# Patient Record
Sex: Male | Born: 1985 | Race: White | Hispanic: No | Marital: Married
Health system: Midwestern US, Community
[De-identification: ages and names within clinical notes are randomized; demographics above are authoritative.]

## PROBLEM LIST (undated history)

## (undated) DIAGNOSIS — G43909 Migraine, unspecified, not intractable, without status migrainosus: Secondary | ICD-10-CM

## (undated) DIAGNOSIS — D6851 Activated protein C resistance: Secondary | ICD-10-CM

## (undated) DIAGNOSIS — S069XAA Unspecified intracranial injury with loss of consciousness status unknown, initial encounter: Secondary | ICD-10-CM

## (undated) DIAGNOSIS — K311 Adult hypertrophic pyloric stenosis: Secondary | ICD-10-CM

## (undated) DIAGNOSIS — K561 Intussusception: Secondary | ICD-10-CM

## (undated) DIAGNOSIS — F4310 Post-traumatic stress disorder, unspecified: Secondary | ICD-10-CM

## (undated) DIAGNOSIS — K219 Gastro-esophageal reflux disease without esophagitis: Secondary | ICD-10-CM

## (undated) DIAGNOSIS — I2699 Other pulmonary embolism without acute cor pulmonale: Secondary | ICD-10-CM

## (undated) DIAGNOSIS — K3184 Gastroparesis: Secondary | ICD-10-CM

## (undated) DIAGNOSIS — A419 Sepsis, unspecified organism: Secondary | ICD-10-CM

## (undated) DIAGNOSIS — R569 Unspecified convulsions: Secondary | ICD-10-CM

## (undated) DIAGNOSIS — S069X9A Unspecified intracranial injury with loss of consciousness of unspecified duration, initial encounter: Secondary | ICD-10-CM

## (undated) DIAGNOSIS — K56609 Unspecified intestinal obstruction, unspecified as to partial versus complete obstruction: Secondary | ICD-10-CM

## (undated) DIAGNOSIS — I82409 Acute embolism and thrombosis of unspecified deep veins of unspecified lower extremity: Secondary | ICD-10-CM

## (undated) DIAGNOSIS — J9811 Atelectasis: Secondary | ICD-10-CM

## (undated) HISTORY — PX: CHOLECYSTECTOMY: SHX55

## (undated) HISTORY — PX: COLON SURGERY: SHX602

## (undated) HISTORY — PX: BACK SURGERY: SHX140

## (undated) HISTORY — PX: LASIK: SHX215

## (undated) HISTORY — PX: APPENDECTOMY: SHX54

## (undated) HISTORY — PX: HAND SURGERY: SHX662

## (undated) HISTORY — PX: ABDOMINAL SURGERY: SHX537

## (undated) HISTORY — PX: APPENDECTOMY (OPEN): SHX54

## (undated) HISTORY — PX: OTHER SURGICAL HISTORY: SHX169

---

## 2007-05-10 ENCOUNTER — Emergency Department
Admission: EM | Admit: 2007-05-10 | Disposition: A | Discharge status: Home / Self Care | Payer: Self-pay | Source: Emergency Department | Admitting: Emergency Medicine

## 2007-05-12 LAB — COMPREHENSIVE METABOLIC PANEL
ALT: 43 U/L (ref 7–56)
AST (SGOT): 36 U/L (ref 5–40)
Albumin, Synovial: 4.9 g/dL (ref 3.9–5.0)
Alkaline Phosphatase: 63 U/L (ref 38–126)
BUN / Creatinine Ratio: 17 (ref 8–20)
BUN: 19 mg/dL (ref 6–20)
Bilirubin, Total: 0.6 mg/dL (ref 0.2–1.3)
CO2: 22 mmol/L (ref 21.0–31.0)
Calcium: 9.7 mg/dL (ref 8.4–10.2)
Chloride: 101 mmol/L (ref 101–111)
Creatinine: 1.13 mg/dL (ref 0.5–1.4)
EGFR: >60 mL/min/{1.73_m2}
EGFR: >60 mL/min/{1.73_m2}
Glucose: 93 mg/dL (ref 70–100)
Potassium: 4.4 mmol/L (ref 3.6–5.0)
Protein, Total: 7.9 g/dL (ref 6.3–8.2)
Sodium: 137 mmol/L (ref 135–145)

## 2007-05-12 LAB — ^CBC WITH DIFF MCKESSON
BASOPHILS %: 0.2 % (ref 0–2)
Baso(Absolute): 0
Eosinophils %: 1.5 % (ref 0–6)
Eosinophils Absolute: 0.1
Hematocrit: 47.4 % (ref 39.0–49.0)
Hemoglobin: 16.2 g/dL (ref 13.2–17.3)
Lymphocytes Absolute: 1.6
Lymphocytes Relative: 18.8 % — ABNORMAL LOW (ref 25–55)
MCH: 30.2 pg (ref 27.0–34.0)
MCHC: 34.3 % (ref 32.0–36.0)
MCV: 88 fL (ref 80–100)
Monocytes Absolute: 0.5
Monocytes Relative %: 6.2 % (ref 1–8)
Neutrophils Absolute: 6.2
Neutrophils Relative %: 73.3 % — ABNORMAL HIGH (ref 49–69)
Platelets: 299 10*3/uL (ref 150–400)
RBC: 5.38 /mm3 (ref 3.80–5.40)
RDW: 12.2 % (ref 11.0–14.0)
WBC: 8.5 10*3/uL (ref 4.8–10.8)

## 2007-05-12 LAB — LIPASE: Lipase: 60 U/L (ref 23–300)

## 2013-10-30 ENCOUNTER — Emergency Department
Admission: EM | Admit: 2013-10-30 | Discharge: 2013-10-31 | Disposition: A | Discharge status: Home / Self Care | Payer: Medicare Other | Attending: Emergency Medicine | Admitting: Emergency Medicine

## 2013-10-30 ENCOUNTER — Emergency Department: Payer: Medicare Other

## 2013-10-30 DIAGNOSIS — R51 Headache: Secondary | ICD-10-CM | POA: Insufficient documentation

## 2013-10-30 DIAGNOSIS — R519 Headache, unspecified: Secondary | ICD-10-CM

## 2013-10-30 DIAGNOSIS — K219 Gastro-esophageal reflux disease without esophagitis: Secondary | ICD-10-CM | POA: Insufficient documentation

## 2013-10-30 DIAGNOSIS — F4310 Post-traumatic stress disorder, unspecified: Secondary | ICD-10-CM | POA: Insufficient documentation

## 2013-10-30 DIAGNOSIS — Z86711 Personal history of pulmonary embolism: Secondary | ICD-10-CM | POA: Insufficient documentation

## 2013-10-30 DIAGNOSIS — Z87820 Personal history of traumatic brain injury: Secondary | ICD-10-CM | POA: Insufficient documentation

## 2013-10-30 HISTORY — DX: Other pulmonary embolism without acute cor pulmonale: I26.99

## 2013-10-30 HISTORY — DX: Unspecified intracranial injury with loss of consciousness status unknown, initial encounter: S06.9XAA

## 2013-10-30 HISTORY — DX: Unspecified convulsions: R56.9

## 2013-10-30 HISTORY — DX: Gastro-esophageal reflux disease without esophagitis: K21.9

## 2013-10-30 HISTORY — DX: Post-traumatic stress disorder, unspecified: F43.10

## 2013-10-30 HISTORY — DX: Unspecified intracranial injury with loss of consciousness of unspecified duration, initial encounter: S06.9X9A

## 2013-10-30 NOTE — ED Notes (Signed)
Bed low position, rails up x 1, sister at bedside. Light low for Pt's comfort.
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
## 2013-10-31 MED ORDER — HYDROMORPHONE HCL PF 1 MG/ML IJ SOLN
4.0000 mg | Freq: Once | INTRAMUSCULAR | Status: AC
Start: 2013-10-31 — End: 2013-10-31
  Administered 2013-10-31: 4 mg via INTRAVENOUS
  Filled 2013-10-31: qty 4

## 2013-10-31 MED ORDER — PROMETHAZINE HCL 25 MG/ML IJ SOLN
50.0000 mg | Freq: Once | INTRAMUSCULAR | Status: AC
Start: 2013-10-31 — End: 2013-10-31
  Administered 2013-10-31: 50 mg via INTRAVENOUS
  Filled 2013-10-31: qty 2

## 2013-10-31 MED ORDER — SODIUM CHLORIDE 0.9 % IV SOLN
INTRAVENOUS | Status: DC
Start: 2013-10-31 — End: 2013-10-31

## 2013-10-31 MED ORDER — DIPHENHYDRAMINE HCL 50 MG/ML IJ SOLN
50.0000 mg | Freq: Once | INTRAMUSCULAR | Status: AC
Start: 2013-10-31 — End: 2013-10-31
  Administered 2013-10-31: 50 mg via INTRAVENOUS
  Filled 2013-10-31: qty 1

## 2013-10-31 MED ORDER — SODIUM CHLORIDE 0.9 % IV SOLN
Freq: Once | INTRAVENOUS | Status: AC
Start: 2013-10-31 — End: 2013-10-31
  Filled 2013-10-31: qty 1000

## 2013-10-31 NOTE — ED Notes (Signed)
Medi-Port accessed, pt tolerated well. Sterile technique used. Port flushed with 20 ml nacl.

## 2013-10-31 NOTE — Discharge Instructions (Signed)
Activity as tolerated  Advil/Motrin/Ibuprofen - 600 mg every eight (8) hours for pain  Tylenol - 500 mg every four (4) hours for pain  Continue current medications  Fluids-keep hydrated  Return if you are not improving or are worse     ED Medication Orders      Start     Status Ordering Provider    10/31/13 0018      Every 1 hour,   Status:  Discontinued      Route: Intravenous         Discontinued Terri Skains    10/31/13 0012   diphenhydrAMINE (BENADRYL) injection 50 mg   Once      Route: Intravenous  Ordered Dose: 50 mg         Last MAR action:  Given Terri Skains    10/31/13 0010   promethazine (PHENERGAN) injection 50 mg   Once      Route: Intravenous  Ordered Dose: 50 mg         Last MAR action:  Given Terri Skains    10/31/13 0009   HYDROmorphone (DILAUDID) injection 4 mg   Once      Route: Intravenous  Ordered Dose: 4 mg         Last MAR action:  Given PALACE, Jacelyn Pi

## 2013-10-31 NOTE — ED Notes (Signed)
Mediport flushed with 20 ml nacl. Pt tolerated well.
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
## 2014-05-09 ENCOUNTER — Inpatient Hospital Stay
Admit: 2014-05-09 | Discharge: 2014-05-09 | Disposition: A | Discharge status: Home / Self Care | Attending: Emergency Medicine

## 2014-05-09 MED ORDER — HEPARIN SOD (PORK) LOCK FLUSH 100 UNIT/ML IV SOLN
100 UNIT/ML | INTRAVENOUS | Status: DC
Start: 2014-05-09 — End: 2014-05-09

## 2014-05-09 MED ORDER — HYDROMORPHONE HCL PF 2 MG/ML IJ SOLN
2 MG/ML | Freq: Once | INTRAMUSCULAR | Status: AC
Start: 2014-05-09 — End: 2014-05-09
  Administered 2014-05-09: 13:00:00 2 mg via INTRAVENOUS

## 2014-05-09 MED ORDER — ONDANSETRON HCL 4 MG/2ML IJ SOLN
4 MG/2ML | Freq: Once | INTRAMUSCULAR | Status: AC
Start: 2014-05-09 — End: 2014-05-09
  Administered 2014-05-09: 13:00:00 8 mg via INTRAVENOUS

## 2014-05-09 MED ORDER — DIPHENHYDRAMINE HCL 50 MG/ML IJ SOLN
50 MG/ML | Freq: Once | INTRAMUSCULAR | Status: AC
Start: 2014-05-09 — End: 2014-05-09
  Administered 2014-05-09: 14:00:00 25 mg via INTRAVENOUS

## 2014-05-09 MED FILL — HEPARIN LOCK FLUSH 100 UNIT/ML IV SOLN: 100 UNIT/ML | INTRAVENOUS | Qty: 5

## 2014-05-09 MED FILL — ONDANSETRON HCL 4 MG/2ML IJ SOLN: 4 MG/2ML | INTRAMUSCULAR | Qty: 4

## 2014-05-09 MED FILL — HYDROMORPHONE HCL PF 2 MG/ML IJ SOLN: 2 MG/ML | INTRAMUSCULAR | Qty: 1

## 2014-05-09 MED FILL — DIPHENHYDRAMINE HCL 50 MG/ML IJ SOLN: 50 MG/ML | INTRAMUSCULAR | Qty: 1
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
## 2014-05-09 NOTE — ED Notes (Signed)
Pt remains alert states still has pain medicated with benadryl DR Tyson AliasGifford talking with pt    Lonna CobbRegina Kiearra Oyervides, RN  05/09/14 [phone]

## 2014-10-16 ENCOUNTER — Encounter

## 2014-10-16 DIAGNOSIS — R109 Unspecified abdominal pain: Secondary | ICD-10-CM

## 2014-10-17 ENCOUNTER — Inpatient Hospital Stay: Admit: 2014-10-17 | Discharge: 2014-10-17 | Attending: Emergency Medicine

## 2014-10-17 ENCOUNTER — Encounter: Admit: 2014-10-17

## 2014-10-17 LAB — POCT VENOUS
CO2: 19 mmol/L — ABNORMAL LOW (ref 21–32)
Calcium, Ionized: 1.23 mmol/L (ref 1.12–1.32)
GFR African American: >60
GFR Non-African American: >60 (ref >60)
POC Anion Gap: 16 (ref 10–20)
POC BUN: 18 mg/dL (ref 7–25)
POC Chloride: 110 mmol/L (ref 98–110)
POC Creatinine: 1 mg/dL (ref 0.6–1.3)
POC Glucose: 102 mg/dL — ABNORMAL HIGH (ref 65–99)
POC Potassium: 3.5 mmol/L (ref 3.5–5.3)
POC Sodium: 145 mmol/L (ref 135–146)

## 2014-10-17 LAB — URINALYSIS
Bilirubin Urine: NEGATIVE
Blood, Urine: NEGATIVE
Glucose, Ur: NEGATIVE mg/dL
Leukocyte Esterase, Urine: NEGATIVE
Nitrite, Urine: NEGATIVE
Protein, UA: NEGATIVE mg/dL
Specific Gravity, UA: >=1.03 (ref 1.005–1.030)
Urobilinogen, Urine: 0.2 E.U./dL (ref <2.0)
pH, UA: 5.5 (ref 5.0–8.0)

## 2014-10-17 LAB — CBC WITH AUTO DIFFERENTIAL
Basophils %: 1 %
Basophils Absolute: 0.1 10*3/uL (ref 0.0–0.2)
Eosinophils %: 4.3 %
Eosinophils Absolute: 0.3 10*3/uL (ref 0.0–0.6)
Hematocrit: 44 % (ref 40.5–52.5)
Hemoglobin: 14.6 g/dL (ref 13.5–17.5)
Lymphocytes %: 31.3 %
Lymphocytes Absolute: 2.1 10*3/uL (ref 1.0–5.1)
MCH: 29.2 pg (ref 26.0–34.0)
MCHC: 33.2 g/dL (ref 31.0–36.0)
MCV: 88 fL (ref 80.0–100.0)
MPV: 8.5 fL (ref 5.0–10.5)
Monocytes %: 10 %
Monocytes Absolute: 0.7 10*3/uL (ref 0.0–1.3)
Neutrophils %: 53.4 %
Neutrophils Absolute: 3.7 10*3/uL (ref 1.7–7.7)
Platelets: 211 10*3/uL (ref 135–450)
RBC: 5 M/uL (ref 4.20–5.90)
RDW: 14.1 % (ref 12.4–15.4)
WBC: 6.9 10*3/uL (ref 4.0–11.0)

## 2014-10-17 LAB — HEPATIC FUNCTION PANEL
ALT: 15 U/L (ref 10–40)
AST: 15 U/L (ref 15–37)
Albumin: 4.3 g/dL (ref 3.4–5.0)
Alkaline Phosphatase: 72 U/L (ref 40–129)
Bilirubin, Direct: <0.2 mg/dL (ref 0.0–0.3)
Total Bilirubin: 0.3 mg/dL (ref 0.0–1.0)
Total Protein: 7.1 g/dL (ref 6.4–8.2)

## 2014-10-17 LAB — LIPASE: Lipase: 36 U/L (ref 13.0–60.0)

## 2014-10-17 MED ORDER — SODIUM CHLORIDE 0.9 % IV BOLUS
0.9 % | Freq: Once | INTRAVENOUS | Status: AC
Start: 2014-10-17 — End: 2014-10-17
  Administered 2014-10-17: 05:00:00 1000 mL via INTRAVENOUS

## 2014-10-17 MED ORDER — HYDROMORPHONE HCL 1 MG/ML IJ SOLN
1 MG/ML | INTRAMUSCULAR | Status: AC | PRN
Start: 2014-10-17 — End: 2014-10-17
  Administered 2014-10-17 (×2): 1 mg via INTRAVENOUS

## 2014-10-17 MED ORDER — IOVERSOL 74 % IV SOLN
74 % | Freq: Once | INTRAVENOUS | Status: AC | PRN
Start: 2014-10-17 — End: 2014-10-17
  Administered 2014-10-17: 09:00:00 100 mL via INTRAVENOUS

## 2014-10-17 MED ORDER — PROMETHAZINE HCL 25 MG PO TABS
25 MG | Freq: Once | ORAL | Status: AC
Start: 2014-10-17 — End: 2014-10-17
  Administered 2014-10-17: 11:00:00 25 mg via ORAL

## 2014-10-17 MED ORDER — HEPARIN SOD (PORK) LOCK FLUSH 100 UNIT/ML IV SOLN
100 UNIT/ML | INTRAVENOUS | Status: DC | PRN
Start: 2014-10-17 — End: 2014-10-17
  Administered 2014-10-17: 12:00:00 500 [IU]

## 2014-10-17 MED ORDER — HYDROMORPHONE HCL 2 MG/ML IJ SOLN
2 MG/ML | Freq: Once | INTRAMUSCULAR | Status: AC
Start: 2014-10-17 — End: 2014-10-17
  Administered 2014-10-17: 11:00:00 2 mg via INTRAVENOUS

## 2014-10-17 MED ORDER — DIPHENHYDRAMINE HCL 50 MG/ML IJ SOLN
50 MG/ML | Freq: Once | INTRAMUSCULAR | Status: AC
Start: 2014-10-17 — End: 2014-10-17
  Administered 2014-10-17: 06:00:00 25 mg via INTRAVENOUS

## 2014-10-17 MED ORDER — DIPHENHYDRAMINE HCL 50 MG/ML IJ SOLN
50 MG/ML | Freq: Once | INTRAMUSCULAR | Status: AC
Start: 2014-10-17 — End: 2014-10-17
  Administered 2014-10-17: 05:00:00 25 mg via INTRAVENOUS

## 2014-10-17 MED ORDER — PROMETHAZINE HCL 25 MG/ML IJ SOLN
25 MG/ML | Freq: Once | INTRAMUSCULAR | Status: AC
Start: 2014-10-17 — End: 2014-10-17
  Administered 2014-10-17: 06:00:00 12.5 mg via INTRAVENOUS

## 2014-10-17 MED ORDER — IOHEXOL 240 MG/ML IJ SOLN
240 MG/ML | Freq: Once | INTRAMUSCULAR | Status: AC | PRN
Start: 2014-10-17 — End: 2014-10-17
  Administered 2014-10-17: 08:00:00 50 mL via ORAL

## 2014-10-17 MED ORDER — HYDROMORPHONE HCL 2 MG/ML IJ SOLN
2 MG/ML | Freq: Once | INTRAMUSCULAR | Status: AC
Start: 2014-10-17 — End: 2014-10-17
  Administered 2014-10-17: 07:00:00 2 mg via INTRAVENOUS

## 2014-10-17 MED ORDER — ONDANSETRON HCL 4 MG/2ML IJ SOLN
4 MG/2ML | Freq: Once | INTRAMUSCULAR | Status: AC
Start: 2014-10-17 — End: 2014-10-17
  Administered 2014-10-17: 05:00:00 4 mg via INTRAVENOUS

## 2014-10-17 MED ORDER — HYDROMORPHONE HCL 2 MG/ML IJ SOLN
2 MG/ML | Freq: Once | INTRAMUSCULAR | Status: AC
Start: 2014-10-17 — End: 2014-10-17
  Administered 2014-10-17: 08:00:00 2 mg via INTRAVENOUS

## 2014-10-17 MED ORDER — DIPHENHYDRAMINE HCL 25 MG PO TABS
25 MG | Freq: Once | ORAL | Status: AC
Start: 2014-10-17 — End: 2014-10-17
  Administered 2014-10-17: 11:00:00 25 mg via ORAL

## 2014-10-17 MED FILL — PROMETHAZINE HCL 25 MG/ML IJ SOLN: 25 MG/ML | INTRAMUSCULAR | Qty: 1

## 2014-10-17 MED FILL — HYDROMORPHONE HCL 2 MG/ML IJ SOLN: 2 MG/ML | INTRAMUSCULAR | Qty: 1

## 2014-10-17 MED FILL — ONDANSETRON HCL 4 MG/2ML IJ SOLN: 4 MG/2ML | INTRAMUSCULAR | Qty: 2

## 2014-10-17 MED FILL — PROMETHAZINE HCL 25 MG PO TABS: 25 MG | ORAL | Qty: 1

## 2014-10-17 MED FILL — DIPHENHYDRAMINE HCL 50 MG/ML IJ SOLN: 50 MG/ML | INTRAMUSCULAR | Qty: 1

## 2014-10-17 MED FILL — HYDROMORPHONE HCL 1 MG/ML IJ SOLN: 1 MG/ML | INTRAMUSCULAR | Qty: 1

## 2014-10-17 MED FILL — HEPARIN LOCK FLUSH 100 UNIT/ML IV SOLN: 100 UNIT/ML | INTRAVENOUS | Qty: 5

## 2014-10-17 MED FILL — DIPHENHYDRAMINE HCL 25 MG PO TABS: 25 MG | ORAL | Qty: 1

## 2014-10-17 NOTE — ED Notes (Signed)
Pt discharged with written instructions. Pt verbalizes understanding and walked to lobby without difficulty.    Leighton Parody, RN  10/17/14 [phone]

## 2014-10-17 NOTE — ED Provider Notes (Signed)
Chief Complaint   Abdominal Pain; and Emesis      Nursing Notes, Past Medical Hx, Past Surgical Hx, Social Hx, Allergies, and Family Hx were all reviewed.    History of Present Illness     Stephen Cunningham is a 28 y.o. male who presents to the emergency department with mid upper abdominal pain, with associated nonbilious nonbloody nausea and vomiting.  Patient states that this started several hours ago, and he describes the pain is 9 out of 10, sharp and stabbing and continuous.  Patient has had similar episodes before.  Patient denies fevers or chills.  Denies any diarrhea or dysuria.  No other complaints at this time    Review of Systems     As reviewed in HPI, otherwise negative    Past Medical, Surgical, Family, and Social History     He has a past medical history of Pyloric stenosis; Gastroparesis; TBI (traumatic brain injury) (HCC); Seizures (HCC); PE (pulmonary embolism) (HCC); and Headache(784.0).  He has past surgical history that includes Cholecystectomy; Hand surgery (Right); Tunneled venous port placement (09/20/14  CSN [id number] MRN [id number]); and Abdomen surgery.  His family history is not on file.  He reports that he has never smoked. He does not have any smokeless tobacco history on file. He reports that he does not drink alcohol or use illicit drugs.          Medications     Previous Medications    BUTORPHANOL TARTRATE (STADOL NS NA)    by Nasal route    CLONAZEPAM (KLONOPIN) 1 MG TABS    Take 1 tablet by mouth as needed    HYDROMORPHONE (DILAUDID) 8 MG TABLET    Take 8 mg by mouth every 4 hours as needed for Pain    HYDROXYZINE (ATARAX) 10 MG TABLET    Take 10 mg by mouth 3 times daily as needed for Itching    PROMETHAZINE (PHENERGAN) 25 MG TABLET    Take 25 mg by mouth every 6 hours as needed for Nausea    TOPIRAMATE (TOPAMAX) 200 MG TABLET    Take 300 mg by mouth 2 times daily       Allergies     He is allergic to amoxicillin; aspirin; dicyclomine; glycopyrrolate; mirtazapine; neurontin; pcn;  prochlorperazine edisylate; rizatriptan; sumatriptan; triplen; triptans; demerol hcl; fentanyl; morphine; morpholine salicylate; nortriptyline; prochlorperazine; reglan; tomato; toradol; warfarin and related; and pamabrom.    Physical Exam     INITIAL VITALS: BP 151/88 mmHg   Pulse 110   Temp(Src) 97 ??F (36.1 ??C) (Oral)   Resp 18   Ht 5\' 11"  (1.803 m)   Wt 190 lb (86.183 kg)   BMI 26.51 kg/m2   SpO2 97%     General: Well developed, 28 y.o. male who is alert and oriented, in no acute distress.  HEENT: Head is normocephalic, atraumatic; Pupils are equal, round and reactive to light, extraocular movements are intact,  Sclera white; mucous membranes moist and oropharynx is clear.  Neck: Supple, no meningismus, no JVD, no tracheal shift.  CV: Regular rate and rhythm with strong distal pulses. No murmur appreciated.  Chest: Clear to ascultation bilaterally; symmetrical rise and fall of chest wall  Abd: Soft, tender to palpation diffusely.  No rebound or peritoneal signs  Musculoskeletal: Moves all extremities with no signs of orthopedic trauma, cyanosis, or edema.  Skin: Warm and dry with no rashes or lesions.  Neuro: Awake and alert with no focal neurologic deficits.  Normal speech.  Psych: Appropriate affect.      Diagnostic Results         RADIOLOGY:  XR ACUTE ABD SERIES CHEST 1 VW    Final Result: IMPRESSION:         1. Negative       CT ABDOMEN PELVIS W CONTRAST    Final Result: IMPRESSION:           1. No acute intra-abdominal or pelvic process identified.    2. Changes of prior cholecystectomy.    3. A few scattered colonic diverticula. No evidence of diverticulitis.            Electronically signed by:  Rudolpho Sevin, M.D.     10/17/2014 4:54:00 AM   XR ACUTE ABD SERIES CHEST 1 VW    (Canceled)   XR ACUTE ABD SERIES CHEST 1 VW    (Canceled)       X-rays were interpreted by me if there was an absence of a real-time radiology read, and the patient was informed that any discrepancies would be conveyed to them upon  review      LABS:   Labs Reviewed   URINALYSIS - Abnormal; Notable for the following:     Ketones, Urine TRACE (*)     All other components within normal limits   POCT VENOUS - Abnormal; Notable for the following:     CO2 19 (*)     POC Glucose 102 (*)     All other components within normal limits   CBC WITH AUTO DIFFERENTIAL   HEPATIC FUNCTION PANEL   LIPASE   POCT CHEM BASIC W ICA           RECENT VITALS:  BP: 151/88 mmHg, Temp: 97 ??F (36.1 ??C), Pulse: 110, Resp: 18         ED Course     The patient was given the following medications:  Orders Placed This Encounter   Medications   ??? 0.9 % sodium chloride bolus     Sig:    ??? ondansetron (ZOFRAN) injection 4 mg     Sig:    ??? diphenhydrAMINE (BENADRYL) injection 25 mg     Sig:    ??? HYDROmorphone (DILAUDID) injection 1 mg     Sig:    ??? promethazine (PHENERGAN) injection 12.5 mg     Sig:    ??? diphenhydrAMINE (BENADRYL) injection 25 mg     Sig:    ??? HYDROmorphone (DILAUDID) injection 2 mg     Sig:    ??? HYDROmorphone (DILAUDID) injection 2 mg     Sig:    ??? iohexol (OMNIPAQUE 240) injection 50 mL     Sig:    ??? ioversol (OPTIRAY) 74 % injection 100 mL     Sig:    ??? HYDROmorphone (DILAUDID) injection 2 mg     Sig:    ??? diphenhydrAMINE (BENADRYL) tablet 25 mg     Sig:    ??? promethazine (PHENERGAN) tablet 25 mg     Sig:    ??? heparin flush 100 UNIT/ML injection 500 Units     Sig:          CONSULTS:  None    MEDICAL DECISION MAKING     Stephen Cunningham is a 28 y.o. male who was seen and evaluated by myself. After thorough physical examination and review of the patient's history of present illness and past medical history, it was determined that the next appropriate step was to perform the diagnostic  studies as seen in the chart and to treat the patients symptoms.  At this time the patient has no history, physical, radiographic, or laboratory findings for acute intra-abdominal pathology and the patient is appropriate for discharge and outpatient follow-up given his p.o. intake, and  well appearance.    The patient tolerated their visit well.  The patient and/or the family were informed of the results of all tests, a time was given to answer questions, a plan was proposed, and they agreed with plan.  The patient and/or family were thoroughly encouraged follow up with their primary care physician and to return to the Emergency Department for any worsening of symptoms or concerns.        Clinical Impression     1. Abdominal pain in male        Disposition/Plan     PATIENT REFERRED TO:  No follow-up provider specified.    DISCHARGE MEDICATIONS:  New Prescriptions    No medications on file       DISPOSITION Decision to Discharge                    Rollene Rotunda, MD  10/30/14 [phone]

## 2014-10-17 NOTE — ED Notes (Signed)
PAC deaccessed, flushed with 10 mL NS and 5mL Heparin flush prior. No bleeding at site.    Leighton Parody, RN  10/17/14 [phone]

## 2014-10-24 DIAGNOSIS — G8929 Other chronic pain: Secondary | ICD-10-CM

## 2014-10-24 NOTE — ED Notes (Signed)
Patient given cab voucher d/t not having a ride. States he is staying with another friend who is a disabled vet who cannot come get him at this time. He has made multiple other attempts at finding a ride. Patient prepared for and ready to be discharged. Patient discharged at this time in no acute distress after verbalizing understanding of discharge instructions. Patient left after receiving After Visit Summary instructions.     Roetta SessionsMegan A Condit, RN  10/24/14 [phone]

## 2014-10-24 NOTE — ED Notes (Signed)
Medication list complete per patient.     Deveron FurlongJulia Lauren Tenishia Ekman, RN  10/24/14 [phone]

## 2014-10-24 NOTE — ED Provider Notes (Signed)
ED Attending Attestation Note     Date of evaluation: 10/24/2014    This patient was seen by the resident physician.  I have seen and examined the patient, I agree with the workup, evaluation, management and diagnosis. The care plan has been discussed and I concur.  My assessment reveals well-appearing male presenting with abdominal pain.  On chart review is a long history of chronic abdominal pain.  Exam reveals a soft abdomen and reassuring vital signs.      Gust Rung III, MD  10/24/14 2134
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
## 2014-10-24 NOTE — ED Notes (Signed)
Pt c/o abd pain.    Josem Kaufmann, RN  10/24/14 2026

## 2014-10-25 ENCOUNTER — Inpatient Hospital Stay: Admit: 2014-10-25 | Discharge: 2014-10-25 | Attending: Emergency Medicine

## 2014-10-25 LAB — HEPATIC FUNCTION PANEL
ALT: 16 U/L (ref 10–40)
AST: 15 U/L (ref 15–37)
Albumin: 4.4 g/dL (ref 3.4–5.0)
Alkaline Phosphatase: 67 U/L (ref 40–129)
Bilirubin, Direct: <0.2 mg/dL (ref 0.0–0.3)
Total Bilirubin: 0.6 mg/dL (ref 0.0–1.0)
Total Protein: 6.5 g/dL (ref 6.4–8.2)

## 2014-10-25 LAB — CBC WITH AUTO DIFFERENTIAL
Basophils %: 1 %
Basophils Absolute: 0.1 10*3/uL (ref 0.0–0.2)
Eosinophils %: 2.9 %
Eosinophils Absolute: 0.2 10*3/uL (ref 0.0–0.6)
Hematocrit: 43.7 % (ref 40.5–52.5)
Hemoglobin: 14.8 g/dL (ref 13.5–17.5)
Lymphocytes %: 22.5 %
Lymphocytes Absolute: 1.7 10*3/uL (ref 1.0–5.1)
MCH: 29.8 pg (ref 26.0–34.0)
MCHC: 33.9 g/dL (ref 31.0–36.0)
MCV: 88.2 fL (ref 80.0–100.0)
MPV: 8.2 fL (ref 5.0–10.5)
Monocytes %: 8.1 %
Monocytes Absolute: 0.6 10*3/uL (ref 0.0–1.3)
Neutrophils %: 65.5 %
Neutrophils Absolute: 4.9 10*3/uL (ref 1.7–7.7)
Platelets: 246 10*3/uL (ref 135–450)
RBC: 4.96 M/uL (ref 4.20–5.90)
RDW: 13.8 % (ref 12.4–15.4)
WBC: 7.4 10*3/uL (ref 4.0–11.0)

## 2014-10-25 LAB — BASIC METABOLIC PANEL
Anion Gap: 14 (ref 3–16)
BUN: 7 mg/dL (ref 7–20)
CO2: 26 mmol/L (ref 21–32)
Calcium: 9 mg/dL (ref 8.3–10.6)
Chloride: 105 mmol/L (ref 99–110)
Creatinine: 0.7 mg/dL — ABNORMAL LOW (ref 0.9–1.3)
GFR African American: >60 (ref >60)
GFR Non-African American: >60 (ref >60)
Glucose: 100 mg/dL — ABNORMAL HIGH (ref 70–99)
Potassium: 3.6 mmol/L (ref 3.5–5.1)
Sodium: 145 mmol/L (ref 136–145)

## 2014-10-25 LAB — LIPASE: Lipase: 32 U/L (ref 13.0–60.0)

## 2014-10-25 MED ORDER — HEPARIN SOD (PORK) LOCK FLUSH 100 UNIT/ML IV SOLN
100 UNIT/ML | INTRAVENOUS | Status: DC | PRN
Start: 2014-10-25 — End: 2014-10-25

## 2014-10-25 MED ORDER — HYDROMORPHONE HCL 1 MG/ML IJ SOLN
1 MG/ML | INTRAMUSCULAR | Status: DC | PRN
Start: 2014-10-25 — End: 2014-10-25
  Administered 2014-10-25: 02:00:00 1 mg via INTRAVENOUS

## 2014-10-25 MED ORDER — HYDROMORPHONE HCL 1 MG/ML IJ SOLN
1 MG/ML | Freq: Once | INTRAMUSCULAR | Status: AC
Start: 2014-10-25 — End: 2014-10-24
  Administered 2014-10-25: 04:00:00 1 mg via INTRAVENOUS

## 2014-10-25 MED ORDER — HYDROMORPHONE HCL 1 MG/ML IJ SOLN
1 MG/ML | Freq: Once | INTRAMUSCULAR | Status: AC
Start: 2014-10-25 — End: 2014-10-24
  Administered 2014-10-25: 03:00:00 1 mg via INTRAVENOUS

## 2014-10-25 MED ORDER — SODIUM CHLORIDE 0.9 % IV BOLUS
0.9 % | Freq: Once | INTRAVENOUS | Status: AC
Start: 2014-10-25 — End: 2014-10-24
  Administered 2014-10-25: 02:00:00 1000 mL via INTRAVENOUS

## 2014-10-25 MED ORDER — ONDANSETRON HCL 4 MG/2ML IJ SOLN
4 MG/2ML | Freq: Once | INTRAMUSCULAR | Status: AC
Start: 2014-10-25 — End: 2014-10-24
  Administered 2014-10-25: 02:00:00 4 mg via INTRAVENOUS

## 2014-10-25 MED ORDER — GI COCKTAIL
Status: DC
Start: 2014-10-25 — End: 2014-10-25

## 2014-10-25 MED ORDER — HEPARIN SOD (PORK) LOCK FLUSH 100 UNIT/ML IV SOLN
100 UNIT/ML | INTRAVENOUS | Status: AC
Start: 2014-10-25 — End: 2014-10-24
  Administered 2014-10-25: 04:00:00 300

## 2014-10-25 MED ORDER — GI COCKTAIL
Freq: Once | Status: AC
Start: 2014-10-25 — End: 2014-10-24
  Administered 2014-10-25: 03:00:00 15 mL via ORAL

## 2014-10-25 MED ORDER — DIPHENHYDRAMINE HCL 50 MG/ML IJ SOLN
50 MG/ML | Freq: Once | INTRAMUSCULAR | Status: AC
Start: 2014-10-25 — End: 2014-10-24
  Administered 2014-10-25: 02:00:00 25 mg via INTRAVENOUS

## 2014-10-25 MED FILL — HYDROMORPHONE HCL 1 MG/ML IJ SOLN: 1 MG/ML | INTRAMUSCULAR | Qty: 1

## 2014-10-25 MED FILL — GI COCKTAIL: Qty: 30

## 2014-10-25 MED FILL — ONDANSETRON HCL 4 MG/2ML IJ SOLN: 4 MG/2ML | INTRAMUSCULAR | Qty: 2

## 2014-10-25 MED FILL — DIPHENHYDRAMINE HCL 50 MG/ML IJ SOLN: 50 MG/ML | INTRAMUSCULAR | Qty: 1

## 2014-10-25 MED FILL — HEPARIN LOCK FLUSH 100 UNIT/ML IV SOLN: 100 UNIT/ML | INTRAVENOUS | Qty: 5

## 2015-03-23 ENCOUNTER — Emergency Department: Payer: Medicare Other

## 2015-03-23 ENCOUNTER — Emergency Department
Admission: EM | Admit: 2015-03-23 | Discharge: 2015-03-24 | Disposition: A | Discharge status: Home / Self Care | Payer: Medicare Other | Attending: Emergency Medicine | Admitting: Emergency Medicine

## 2015-03-23 DIAGNOSIS — K219 Gastro-esophageal reflux disease without esophagitis: Secondary | ICD-10-CM | POA: Insufficient documentation

## 2015-03-23 DIAGNOSIS — G4089 Other seizures: Secondary | ICD-10-CM | POA: Insufficient documentation

## 2015-03-23 DIAGNOSIS — Z86711 Personal history of pulmonary embolism: Secondary | ICD-10-CM | POA: Insufficient documentation

## 2015-03-23 DIAGNOSIS — G43909 Migraine, unspecified, not intractable, without status migrainosus: Secondary | ICD-10-CM | POA: Insufficient documentation

## 2015-03-23 DIAGNOSIS — R569 Unspecified convulsions: Secondary | ICD-10-CM

## 2015-03-23 DIAGNOSIS — IMO0002 Reserved for concepts with insufficient information to code with codable children: Secondary | ICD-10-CM

## 2015-03-23 DIAGNOSIS — Z87820 Personal history of traumatic brain injury: Secondary | ICD-10-CM | POA: Insufficient documentation

## 2015-03-23 MED ORDER — DIPHENHYDRAMINE HCL 50 MG/ML IJ SOLN
50.0000 mg | Freq: Once | INTRAMUSCULAR | Status: AC
Start: 2015-03-23 — End: 2015-03-23
  Administered 2015-03-23: 50 mg via INTRAVENOUS
  Filled 2015-03-23: qty 1

## 2015-03-23 MED ORDER — MAGNESIUM SULFATE IN D5W 10-5 MG/ML-% IV SOLN
1.0000 g | Freq: Once | INTRAVENOUS | Status: AC
Start: 2015-03-23 — End: 2015-03-24
  Administered 2015-03-23: 1 g via INTRAVENOUS
  Filled 2015-03-23: qty 100

## 2015-03-23 MED ORDER — LORAZEPAM 2 MG/ML IJ SOLN
1.0000 mg | Freq: Once | INTRAMUSCULAR | Status: AC
Start: 2015-03-23 — End: 2015-03-23
  Administered 2015-03-23: 1 mg via INTRAVENOUS
  Filled 2015-03-23: qty 1

## 2015-03-23 MED ORDER — HYDROMORPHONE HCL 1 MG/ML IJ SOLN
1.0000 mg | Freq: Once | INTRAMUSCULAR | Status: AC
Start: 2015-03-23 — End: 2015-03-23
  Administered 2015-03-23: 1 mg via INTRAVENOUS
  Filled 2015-03-23: qty 1

## 2015-03-23 MED ORDER — PROMETHAZINE HCL 25 MG/ML IJ SOLN
25.0000 mg | Freq: Once | INTRAMUSCULAR | Status: AC
Start: 2015-03-23 — End: 2015-03-23
  Administered 2015-03-23: 25 mg via INTRAVENOUS
  Filled 2015-03-23: qty 1

## 2015-03-23 NOTE — ED Notes (Signed)
Review of [facility] charts:  I, Cari Caraway, MD,  have reviewed the history, physical exam, evaluation, clinical impression and plan and agree.    Seen by me as well.  Erik Wang is a 29 y.o. male for whom I was called to bedside for seizure activity with hx sz.  Male at bedside who knows pt situation well.  Pt with roving eye movements but no convulsing which is a typical pattern.  Will medicate with ativan to break current sz and decrease chance of recurrent sz.  Otherwise this seems like a usual kind of situation for him.      Forest Gleason, MD  03/28/15 [phone]

## 2015-03-23 NOTE — ED Notes (Signed)
See triage note.
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
## 2015-03-24 ENCOUNTER — Emergency Department: Payer: Medicare Other

## 2015-03-24 ENCOUNTER — Emergency Department
Admission: EM | Admit: 2015-03-24 | Discharge: 2015-03-25 | Disposition: A | Discharge status: Home / Self Care | Payer: Medicare Other | Attending: Emergency Medical Services | Admitting: Emergency Medical Services

## 2015-03-24 DIAGNOSIS — Z87820 Personal history of traumatic brain injury: Secondary | ICD-10-CM | POA: Insufficient documentation

## 2015-03-24 DIAGNOSIS — Z86711 Personal history of pulmonary embolism: Secondary | ICD-10-CM | POA: Insufficient documentation

## 2015-03-24 DIAGNOSIS — K3184 Gastroparesis: Secondary | ICD-10-CM | POA: Insufficient documentation

## 2015-03-24 DIAGNOSIS — G43709 Chronic migraine without aura, not intractable, without status migrainosus: Secondary | ICD-10-CM | POA: Insufficient documentation

## 2015-03-24 DIAGNOSIS — K219 Gastro-esophageal reflux disease without esophagitis: Secondary | ICD-10-CM | POA: Insufficient documentation

## 2015-03-24 DIAGNOSIS — IMO0002 Reserved for concepts with insufficient information to code with codable children: Secondary | ICD-10-CM

## 2015-03-24 HISTORY — DX: Adult hypertrophic pyloric stenosis: K31.1

## 2015-03-24 HISTORY — DX: Gastroparesis: K31.84

## 2015-03-24 MED ORDER — ONDANSETRON HCL 4 MG/2ML IJ SOLN
4.0000 mg | Freq: Four times a day (QID) | INTRAMUSCULAR | Status: DC | PRN
Start: 2015-03-24 — End: 2015-03-24
  Administered 2015-03-24: 4 mg via INTRAVENOUS
  Filled 2015-03-24 (×2): qty 2

## 2015-03-24 MED ORDER — PROMETHAZINE HCL 25 MG/ML IJ SOLN
12.5000 mg | Freq: Once | INTRAMUSCULAR | Status: AC
Start: 2015-03-24 — End: 2015-03-24
  Administered 2015-03-24: 12.5 mg via INTRAVENOUS
  Filled 2015-03-24: qty 1

## 2015-03-24 MED ORDER — HYDROMORPHONE HCL 1 MG/ML IJ SOLN
1.0000 mg | Freq: Once | INTRAMUSCULAR | Status: AC
Start: 2015-03-24 — End: 2015-03-24
  Administered 2015-03-24: 1 mg via INTRAVENOUS
  Filled 2015-03-24: qty 1

## 2015-03-24 MED ORDER — HYDROXYZINE HCL 10 MG PO TABS
10.0000 mg | ORAL_TABLET | Freq: Four times a day (QID) | ORAL | Status: DC | PRN
Start: 2015-03-24 — End: 2015-03-24
  Administered 2015-03-24: 10 mg via ORAL
  Filled 2015-03-24: qty 1

## 2015-03-24 MED ORDER — HEPARIN SOD (PORK) LOCK FLUSH 100 UNIT/ML IV SOLN
INTRAVENOUS | Status: AC
Start: 2015-03-24 — End: 2015-03-24
  Filled 2015-03-24: qty 5

## 2015-03-24 MED ORDER — SODIUM CHLORIDE 0.9 % IV BOLUS
1000.0000 mL | Freq: Once | INTRAVENOUS | Status: AC
Start: 2015-03-24 — End: 2015-03-25
  Administered 2015-03-25: 1000 mL via INTRAVENOUS

## 2015-03-24 MED ORDER — LORAZEPAM 2 MG/ML IJ SOLN
1.0000 mg | Freq: Once | INTRAMUSCULAR | Status: AC
Start: 2015-03-24 — End: 2015-03-24
  Administered 2015-03-24: 1 mg via INTRAVENOUS
  Filled 2015-03-24: qty 1

## 2015-03-24 MED ORDER — MAGNESIUM SULFATE IN D5W 10-5 MG/ML-% IV SOLN
1.0000 g | Freq: Once | INTRAVENOUS | Status: AC
Start: 2015-03-24 — End: 2015-03-25
  Administered 2015-03-25: 1 g via INTRAVENOUS
  Filled 2015-03-24: qty 100

## 2015-03-24 MED ORDER — DIPHENHYDRAMINE HCL 50 MG/ML IJ SOLN
25.0000 mg | Freq: Once | INTRAMUSCULAR | Status: AC
Start: 2015-03-24 — End: 2015-03-25
  Administered 2015-03-25: 25 mg via INTRAVENOUS
  Filled 2015-03-24: qty 1

## 2015-03-24 MED ORDER — PROMETHAZINE HCL 25 MG/ML IJ SOLN
12.5000 mg | Freq: Once | INTRAMUSCULAR | Status: DC
Start: 2015-03-24 — End: 2015-03-24
  Filled 2015-03-24: qty 1

## 2015-03-24 MED ORDER — PROMETHAZINE HCL 25 MG/ML IJ SOLN
12.5000 mg | Freq: Once | INTRAMUSCULAR | Status: AC
Start: 2015-03-24 — End: 2015-03-25
  Administered 2015-03-25: 12.5 mg via INTRAVENOUS
  Filled 2015-03-24: qty 1

## 2015-03-24 NOTE — ED Notes (Signed)
Pt states he was seen in ED yesterday for same. C/o migraine headache x 4 days. Pt states he had botox injections 4 days ago for his migraines. +sensitivity to light and blurred vision. +sensitivity to sound. Pt states symptoms are not getting any better. Last had 12.5mg  IM phenergan at 1700 and po tramadol at 1700 but vomited shortly after.
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
## 2015-03-24 NOTE — Discharge Instructions (Signed)
Headache, Migraine    You have been diagnosed with a recurrent migraine headache.    Headache is a very common complaint. Most headaches are not dangerous. Your symptoms today sound like your usual headache. It is OK for you to go home. It is important, however, to follow up with your doctor or neurologist.    Migraines are treated with medication to reduce pain. Medications specifically designed for migraine headaches are usually more effective than other pain medications. Ask your doctor about migraine medicines.    Take your medication as directed. This is especially important if your doctor has placed you on a daily medication to prevent headaches.    YOU SHOULD SEEK MEDICAL ATTENTION IMMEDIATELY, EITHER HERE OR AT THE NEAREST EMERGENCY DEPARTMENT, IF ANY OF THE FOLLOWING OCCURS:   Your headache gets worse or does not improve with medication.   You have head pain that is different from your normal headache.   You have a very severe headache that begins suddenly (like an explosion in your head or like a thunderclap).   You have a fever (temperature higher than 100.40F / 38C).   You have loss of feeling or tingling in your arms or legs.   You pass out.   You develop vision problems.   You vomit and cannot take medication or keep medication down.              Seizure, W/ Seizure Disorder    You have been seen today because you had a seizure.    A seizure happens when there is a sudden disturbance in the normal electrical signals in the brain. These signals are sometimes thought of as "brain waves." During a seizure, the normal electrical activity becomes more like an electrical or lightning storm. The out-of-control signals cause all of the symptoms seen in a seizure.    The most common reason that a person with a seizure disorder (epilepsy) will have another seizure is they don't take their medicines like they are supposed to. Some other causes of a seizure in someone who has had a seizure before  include alcohol withdrawal, withdrawal from certain medications, especially "benzodiazepines" (lorazepam (Ativan), diazepam (Valium), alprazolam (Xanax), etc.), illegal drug use, low blood sugar, brain infections, brain tumors, severe head injury (even if it was a while ago), rapid temperature changes in a child with a fever (temperature higher than 100.40F / 38C), and other medical conditions.    Because you have had seizures in the past, it is probably not necessary to take x-rays or test your blood (except to check seizure medication levels).    Be sure to take your medication as directed. This is very important to prevent another seizure. Follow up with your regular doctor or neurologist. Make sure your doctor knows that you had another seizure because you may need to change your medications.    Be sure to stay with a responsible adult tonight. Make sure this person knows not to put anything in your mouth or try to restrain you when you are having a seizure.    Avoid alcoholic beverages.    For your safety AND the safety of others, DO NOT drive a motor vehicle or operate any other heavy equipment until you are cleared to do so by your doctor!    YOU SHOULD SEEK MEDICAL ATTENTION IMMEDIATELY, EITHER HERE OR AT THE NEAREST EMERGENCY DEPARTMENT, IF ANY OF THE FOLLOWING OCCURS:   You have a seizure that is different from your normal seizure  or lasts longer than usual.   You have two seizures in a row without waking up (returning to normal) in between.   You have a seizure that lasts longer than 10 minutes.   You have confusion that does not go away.       Dear Erik Wang,    You were seen today by Carolina Cellar, PA-C. Thank you for choosing the [facility] for your healthcare needs.  We hope your visit today was EXCELLENT.  Please take any medications prescribed as directed.     If you have any questions or concerns, I am available at [phone]. Please do not hesitate to  contact me if I can be of assistance.     Below is some information and resources that our patients often find helpful.    Sincerely,    Carolina Cellar, Physician Assistant  [facility]    ________________________________________________________________  Thank you for choosing [facility] for your emergency care needs.  We strive to provide EXCELLENT care to you and your family.      If you do not continue to improve or your condition worsens, please contact your doctor or return immediately to the Emergency Department.    DOCTOR REFERRALS  Call [phone] (available 24 hours a day, 7 days a week) if you need any further referrals and we can help you find a primary care doctor or specialist.  Also, available online at:  [email or web address]    YOUR CONTACT INFORMATION  Before leaving please check with registration to make sure we have an up-to-date contact number.  You can call registration at [phone] to update your information.  For questions about your hospital bill, please call [phone].  For questions about your Emergency Dept Physician bill please call [phone].      FREE HEALTH SERVICES  If you need help with health or social services, please call 2-1-1 for a free referral to resources in your area.  2-1-1 is a free service connecting people with information on health insurance, free clinics, pregnancy, mental health, dental care, food assistance, housing, and substance abuse counseling.  Also, available online at:  http://[email or web address].org    MEDICAL RECORDS AND TESTS  Certain laboratory test results do not come back the same day, for example urine cultures.  We will contact you if other important findings are noted. Radiology films are often reviewed again to ensure accuracy.  If there is any discrepancy, we will notify you.    Please call [phone] to pick up a complimentary CD of any radiology studies performed.   If you or your doctor would like to request a copy of your medical records, please call [phone].      ORTHOPEDIC INJURY   Please know that significant injuries can exist even when an initial x-ray is read as normal or negative.  This can occur because some fractures (broken bones) are not initially visible on x-rays.  For this reason, close outpatient follow-up with your primary care doctor or bone specialist (orthopedist) is required.    MEDICATIONS AND FOLLOWUP  Please be aware that some prescription medications can cause drowsiness.  Use caution when driving or operating machinery.    The examination and treatment you have received in our Emergency Department is provided on an emergency basis, and is not intended to be a substitute for your primary care physician.  It is important that your doctor checks you  again and that you report any new or remaining problems at that time.      [place], [place], [place] [address] (1.4 miles, 7 minutes)  [place], [place], [place] [address] (6.5 miles, 13 minutes)  Handout with directions available on request.

## 2015-03-24 NOTE — Progress Notes (Signed)
Pt seizing; sister at bedside. Dr. Lucia Estelle notified. Ativan ordered.

## 2015-03-24 NOTE — Special Discharge Instructions (Cosign Needed)
Return if you develop fevers, neck pain, or worsening symptoms.   Otherwise, follow up with your neurologist upon return to [place].

## 2015-03-25 DIAGNOSIS — G43709 Chronic migraine without aura, not intractable, without status migrainosus: Secondary | ICD-10-CM

## 2015-03-25 LAB — BASIC METABOLIC PANEL
Anion Gap: 7 (ref 5.0–15.0)
BUN: 16 mg/dL (ref 9–28)
CO2: 21 mEq/L — ABNORMAL LOW (ref 22–29)
Calcium: 8.7 mg/dL (ref 8.5–10.5)
Chloride: 112 mEq/L — ABNORMAL HIGH (ref 100–111)
Creatinine: 1 mg/dL (ref 0.7–1.3)
Glucose: 108 mg/dL — ABNORMAL HIGH (ref 70–100)
Potassium: 4.2 mEq/L (ref 3.5–5.1)
Sodium: 140 mEq/L (ref 136–145)

## 2015-03-25 LAB — CBC AND DIFFERENTIAL
Hematocrit: 38.3 % — ABNORMAL LOW (ref 42.0–52.0)
Hgb: 14.1 g/dL (ref 13.0–17.0)
MCH: 30.1 pg (ref 28.0–32.0)
MCHC: 36.8 g/dL — ABNORMAL HIGH (ref 32.0–36.0)
MCV: 81.8 fL (ref 80.0–100.0)
MPV: 9.9 fL (ref 9.4–12.3)
Platelets: 227 10*3/uL (ref 140–400)
RBC: 4.68 10*6/uL — ABNORMAL LOW (ref 4.70–6.00)
RDW: 13 % (ref 12–15)
WBC: 6.83 10*3/uL (ref 3.50–10.80)

## 2015-03-25 LAB — LIPASE: Lipase: 64 U/L (ref 8–78)

## 2015-03-25 LAB — MAN DIFF ONLY
Band Neutrophils Absolute: 0.27 10*3/uL (ref 0.00–1.00)
Band Neutrophils: 4 %
Basophils Absolute Manual: 0 10*3/uL (ref 0.00–0.20)
Basophils Manual: 0 %
Eosinophils Absolute Manual: 0.07 10*3/uL (ref 0.00–0.70)
Eosinophils Manual: 1 %
Lymphocytes Absolute Manual: 1.91 10*3/uL (ref 0.50–4.40)
Lymphocytes Manual: 28 %
Monocytes Absolute: 0.48 10*3/uL (ref 0.00–1.20)
Monocytes Manual: 7 %
Neutrophils Absolute Manual: 4.1 10*3/uL (ref 1.80–8.10)
Nucleated RBC: 0 /100 WBC (ref 0–1)
Segmented Neutrophils: 60 %

## 2015-03-25 LAB — GFR: EGFR: >60

## 2015-03-25 LAB — CELL MORPHOLOGY
Cell Morphology: NORMAL
Platelet Estimate: ADEQUATE

## 2015-03-25 MED ORDER — ONDANSETRON HCL 4 MG/2ML IJ SOLN
4.0000 mg | Freq: Once | INTRAMUSCULAR | Status: AC
Start: 2015-03-25 — End: 2015-03-25
  Administered 2015-03-25: 4 mg via INTRAVENOUS
  Filled 2015-03-25: qty 2

## 2015-03-25 MED ORDER — HEPARIN SOD (PORK) LOCK FLUSH 100 UNIT/ML IV SOLN
5.0000 mL | Freq: Once | INTRAVENOUS | Status: AC
Start: 2015-03-25 — End: 2015-03-25
  Administered 2015-03-25: 5 mL
  Filled 2015-03-25: qty 5

## 2015-03-25 NOTE — ED Notes (Signed)
Signature pad not working at time of discharge.  Pt verbalized understanding of discharge instructions.  No further instructions.

## 2015-03-25 NOTE — Special Discharge Instructions (Cosign Needed)
Follow up with your neurologist as scheduled.   Follow up with your GI doctor a

## 2015-03-25 NOTE — Discharge Instructions (Signed)
Headache, Migraine    You have been diagnosed with a recurrent migraine headache.    Headache is a very common complaint. Most headaches are not dangerous. Your symptoms today sound like your usual headache. It is OK for you to go home. It is important, however, to follow up with your doctor or neurologist.    Migraines are treated with medication to reduce pain. Medications specifically designed for migraine headaches are usually more effective than other pain medications. Ask your doctor about migraine medicines.    Take your medication as directed. This is especially important if your doctor has placed you on a daily medication to prevent headaches.    YOU SHOULD SEEK MEDICAL ATTENTION IMMEDIATELY, EITHER HERE OR AT THE NEAREST EMERGENCY DEPARTMENT, IF ANY OF THE FOLLOWING OCCURS:   Your headache gets worse or does not improve with medication.   You have head pain that is different from your normal headache.   You have a very severe headache that begins suddenly (like an explosion in your head or like a thunderclap).   You have a fever (temperature higher than 100.28F / 38C).   You have loss of feeling or tingling in your arms or legs.   You pass out.   You develop vision problems.   You vomit and cannot take medication or keep medication down.            Dear Erik Wang,    You were seen today by Carolina Cellar, PA-C. Thank you for choosing the [facility] for your healthcare needs.  We hope your visit today was EXCELLENT.  Please take any medications prescribed as directed.     If you have any questions or concerns, I am available at [phone]. Please do not hesitate to contact me if I can be of assistance.     Below is some information and resources that our patients often find helpful.    Sincerely,    Carolina Cellar, Physician Assistant  [facility]    ________________________________________________________________  Thank you for  choosing [facility] for your emergency care needs.  We strive to provide EXCELLENT care to you and your family.      If you do not continue to improve or your condition worsens, please contact your doctor or return immediately to the Emergency Department.    DOCTOR REFERRALS  Call [phone] (available 24 hours a day, 7 days a week) if you need any further referrals and we can help you find a primary care doctor or specialist.  Also, available online at:  [email or web address]    YOUR CONTACT INFORMATION  Before leaving please check with registration to make sure we have an up-to-date contact number.  You can call registration at [phone] to update your information.  For questions about your hospital bill, please call [phone].  For questions about your Emergency Dept Physician bill please call [phone].      FREE HEALTH SERVICES  If you need help with health or social services, please call 2-1-1 for a free referral to resources in your area.  2-1-1 is a free service connecting people with information on health insurance, free clinics, pregnancy, mental health, dental care, food assistance, housing, and substance abuse counseling.  Also, available online at:  http://[email or web address].org    MEDICAL RECORDS AND TESTS  Certain laboratory test results do not come back the same day, for example urine cultures.  We will contact you  if other important findings are noted. Radiology films are often reviewed again to ensure accuracy.  If there is any discrepancy, we will notify you.    Please call [phone] to pick up a complimentary CD of any radiology studies performed.  If you or your doctor would like to request a copy of your medical records, please call [phone].      ORTHOPEDIC INJURY   Please know that significant injuries can exist even when an initial x-ray is read as normal or negative.  This can occur because some fractures (broken bones) are not  initially visible on x-rays.  For this reason, close outpatient follow-up with your primary care doctor or bone specialist (orthopedist) is required.    MEDICATIONS AND FOLLOWUP  Please be aware that some prescription medications can cause drowsiness.  Use caution when driving or operating machinery.    The examination and treatment you have received in our Emergency Department is provided on an emergency basis, and is not intended to be a substitute for your primary care physician.  It is important that your doctor checks you again and that you report any new or remaining problems at that time.      24 HOUR PHARMACIES  [facility] - [address] [place], [place] [address] (1.4 miles, 7 minutes)  [facility] - [address], [place], [place] [address] (6.5 miles, 13 minutes)  Handout with directions available on request.

## 2015-07-07 ENCOUNTER — Emergency Department (HOSPITAL_BASED_OUTPATIENT_CLINIC_OR_DEPARTMENT_OTHER)
Admission: EM | Admit: 2015-07-07 | Discharge: 2015-07-08 | Disposition: A | Discharge status: Home / Self Care | Payer: Medicare Other | Attending: Emergency Medicine | Admitting: Emergency Medicine

## 2015-07-07 ENCOUNTER — Emergency Department (HOSPITAL_BASED_OUTPATIENT_CLINIC_OR_DEPARTMENT_OTHER): Payer: Medicare Other

## 2015-07-07 ENCOUNTER — Encounter (HOSPITAL_BASED_OUTPATIENT_CLINIC_OR_DEPARTMENT_OTHER): Payer: Self-pay

## 2015-07-07 DIAGNOSIS — G40909 Epilepsy, unspecified, not intractable, without status epilepticus: Secondary | ICD-10-CM | POA: Diagnosis not present

## 2015-07-07 DIAGNOSIS — Z880 Allergy status to penicillin: Secondary | ICD-10-CM | POA: Diagnosis not present

## 2015-07-07 DIAGNOSIS — Z79899 Other long term (current) drug therapy: Secondary | ICD-10-CM | POA: Diagnosis not present

## 2015-07-07 DIAGNOSIS — Z8719 Personal history of other diseases of the digestive system: Secondary | ICD-10-CM | POA: Diagnosis not present

## 2015-07-07 DIAGNOSIS — R1011 Right upper quadrant pain: Secondary | ICD-10-CM | POA: Insufficient documentation

## 2015-07-07 DIAGNOSIS — Z87828 Personal history of other (healed) physical injury and trauma: Secondary | ICD-10-CM | POA: Insufficient documentation

## 2015-07-07 DIAGNOSIS — Z87891 Personal history of nicotine dependence: Secondary | ICD-10-CM | POA: Insufficient documentation

## 2015-07-07 DIAGNOSIS — Z86718 Personal history of other venous thrombosis and embolism: Secondary | ICD-10-CM | POA: Insufficient documentation

## 2015-07-07 DIAGNOSIS — Z86711 Personal history of pulmonary embolism: Secondary | ICD-10-CM | POA: Insufficient documentation

## 2015-07-07 DIAGNOSIS — G43909 Migraine, unspecified, not intractable, without status migrainosus: Secondary | ICD-10-CM | POA: Diagnosis not present

## 2015-07-07 HISTORY — DX: Unspecified intracranial injury with loss of consciousness status unknown, initial encounter: S06.9XAA

## 2015-07-07 HISTORY — DX: Atelectasis: J98.11

## 2015-07-07 HISTORY — DX: Intussusception: K56.1

## 2015-07-07 HISTORY — DX: Migraine, unspecified, not intractable, without status migrainosus: G43.909

## 2015-07-07 HISTORY — DX: Unspecified intracranial injury with loss of consciousness of unspecified duration, initial encounter: S06.9X9A

## 2015-07-07 HISTORY — DX: Unspecified intestinal obstruction, unspecified as to partial versus complete obstruction: K56.609

## 2015-07-07 HISTORY — DX: Other pulmonary embolism without acute cor pulmonale: I26.99

## 2015-07-07 HISTORY — DX: Adult hypertrophic pyloric stenosis: K31.1

## 2015-07-07 HISTORY — DX: Acute embolism and thrombosis of unspecified deep veins of unspecified lower extremity: I82.409

## 2015-07-07 HISTORY — DX: Unspecified convulsions: R56.9

## 2015-07-07 LAB — CBC WITH DIFFERENTIAL/PLATELET
BASOS ABS: 0.1 10*3/uL (ref 0.0–0.1)
Basophils Relative: 1 %
EOS PCT: 1 %
Eosinophils Absolute: 0.1 10*3/uL (ref 0.0–0.7)
HEMATOCRIT: 41.5 % (ref 39.0–52.0)
HEMOGLOBIN: 14.8 g/dL (ref 13.0–17.0)
LYMPHS ABS: 1.7 10*3/uL (ref 0.7–4.0)
LYMPHS PCT: 26 %
MCH: 29.4 pg (ref 26.0–34.0)
MCHC: 35.7 g/dL (ref 30.0–36.0)
MCV: 82.3 fL (ref 78.0–100.0)
Monocytes Absolute: 0.5 10*3/uL (ref 0.1–1.0)
Monocytes Relative: 8 %
NEUTROS ABS: 4.3 10*3/uL (ref 1.7–7.7)
NEUTROS PCT: 64 %
PLATELETS: 219 10*3/uL (ref 150–400)
RBC: 5.04 MIL/uL (ref 4.22–5.81)
RDW: 12.2 % (ref 11.5–15.5)
WBC: 6.6 10*3/uL (ref 4.0–10.5)

## 2015-07-07 LAB — COMPREHENSIVE METABOLIC PANEL
ALK PHOS: 49 U/L (ref 38–126)
ALT: 20 U/L (ref 17–63)
AST: 24 U/L (ref 15–41)
Albumin: 4.3 g/dL (ref 3.5–5.0)
Anion gap: 8 (ref 5–15)
BUN: 13 mg/dL (ref 6–20)
CALCIUM: 9 mg/dL (ref 8.9–10.3)
CHLORIDE: 109 mmol/L (ref 101–111)
CO2: 23 mmol/L (ref 22–32)
CREATININE: 0.98 mg/dL (ref 0.61–1.24)
GFR calc Af Amer: >60 mL/min (ref >60)
Glucose, Bld: 94 mg/dL (ref 65–99)
Potassium: 3.9 mmol/L (ref 3.5–5.1)
Sodium: 140 mmol/L (ref 135–145)
Total Bilirubin: 0.5 mg/dL (ref 0.3–1.2)
Total Protein: 6.9 g/dL (ref 6.5–8.1)

## 2015-07-07 LAB — URINALYSIS, ROUTINE W REFLEX MICROSCOPIC
Bilirubin Urine: NEGATIVE
GLUCOSE, UA: NEGATIVE mg/dL
Hgb urine dipstick: NEGATIVE
Ketones, ur: NEGATIVE mg/dL
LEUKOCYTES UA: NEGATIVE
Nitrite: NEGATIVE
PH: 6 (ref 5.0–8.0)
Protein, ur: NEGATIVE mg/dL
Specific Gravity, Urine: 1.023 (ref 1.005–1.030)
Urobilinogen, UA: 0.2 mg/dL (ref 0.0–1.0)

## 2015-07-07 LAB — LIPASE, BLOOD: LIPASE: 24 U/L (ref 22–51)

## 2015-07-07 MED ORDER — ONDANSETRON HCL 4 MG/2ML IJ SOLN
INTRAMUSCULAR | Status: AC
  Administered 2015-07-07: 4 mg via INTRAVENOUS
  Filled 2015-07-07: qty 2

## 2015-07-07 MED ORDER — ONDANSETRON HCL 4 MG/2ML IJ SOLN
4.0000 mg | Freq: Once | INTRAMUSCULAR | Status: AC
  Administered 2015-07-07: 4 mg via INTRAVENOUS

## 2015-07-07 MED ORDER — PROMETHAZINE HCL 25 MG/ML IJ SOLN
25.0000 mg | Freq: Once | INTRAMUSCULAR | Status: AC
  Administered 2015-07-07: 25 mg via INTRAVENOUS
  Filled 2015-07-07: qty 1

## 2015-07-07 MED ORDER — MORPHINE SULFATE (PF) 4 MG/ML IV SOLN
4.0000 mg | Freq: Once | INTRAVENOUS | Status: AC
  Administered 2015-07-07: 4 mg via INTRAVENOUS
  Filled 2015-07-07: qty 1

## 2015-07-07 MED ORDER — SODIUM CHLORIDE 0.9 % IV BOLUS (SEPSIS)
1000.0000 mL | Freq: Once | INTRAVENOUS | Status: AC
  Administered 2015-07-07: 1000 mL via INTRAVENOUS

## 2015-07-07 MED ORDER — DIPHENHYDRAMINE HCL 50 MG/ML IJ SOLN
25.0000 mg | Freq: Once | INTRAMUSCULAR | Status: AC
  Administered 2015-07-07: 25 mg via INTRAVENOUS
  Filled 2015-07-07: qty 1

## 2015-07-07 NOTE — ED Notes (Signed)
Patient's power port to right chest was accessed with a 20G 1inch needle. It flushed appropriately and did not draw back.
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
## 2015-07-08 DIAGNOSIS — R1011 Right upper quadrant pain: Secondary | ICD-10-CM | POA: Diagnosis not present

## 2015-07-08 MED ORDER — MORPHINE SULFATE (PF) 4 MG/ML IV SOLN
4.0000 mg | Freq: Once | INTRAVENOUS | Status: AC
  Administered 2015-07-08: 4 mg via INTRAVENOUS
  Filled 2015-07-08: qty 1

## 2015-07-08 MED ORDER — HEPARIN SOD (PORK) LOCK FLUSH 100 UNIT/ML IV SOLN
500.0000 [IU] | Freq: Once | INTRAVENOUS | Status: AC
  Administered 2015-07-08: 500 [IU]
  Filled 2015-07-08: qty 5

## 2015-07-08 MED ORDER — DIPHENHYDRAMINE HCL 50 MG/ML IJ SOLN
12.5000 mg | Freq: Once | INTRAMUSCULAR | Status: AC
  Administered 2015-07-08: 12.5 mg via INTRAVENOUS
  Filled 2015-07-08: qty 1

## 2015-07-08 MED ORDER — ENOXAPARIN SODIUM 80 MG/0.8ML ~~LOC~~ SOLN
80.0000 mg | Freq: Once | SUBCUTANEOUS | Status: AC
  Administered 2015-07-08: 80 mg via SUBCUTANEOUS
  Filled 2015-07-08: qty 0.8

## 2015-07-08 MED ORDER — IOHEXOL 300 MG/ML  SOLN
100.0000 mL | Freq: Once | INTRAMUSCULAR | Status: AC | PRN
  Administered 2015-07-08: 100 mL via INTRAVENOUS

## 2015-07-08 MED ORDER — IOHEXOL 300 MG/ML  SOLN
50.0000 mL | Freq: Once | INTRAMUSCULAR | Status: AC | PRN
  Administered 2015-07-08: 50 mL via ORAL

## 2015-07-08 NOTE — ED Notes (Signed)
Patient transported to CT 

## 2015-07-08 NOTE — ED Notes (Signed)
Patient returned from CT with tech via stretcher 

## 2015-07-08 NOTE — ED Notes (Signed)
Port port deaccessed by Engineer, civil (consulting). No bleeding noted and no pain upon removal. Flushed with Heparin prior to removal.

## 2015-07-09 LAB — URINE CULTURE
Culture: 1000
Special Requests: NORMAL

## 2015-11-09 ENCOUNTER — Emergency Department: Payer: Medicare Other

## 2015-11-09 ENCOUNTER — Emergency Department
Admission: EM | Admit: 2015-11-09 | Discharge: 2015-11-09 | Disposition: A | Discharge status: Home / Self Care | Payer: Medicare Other | Attending: Emergency Medicine | Admitting: Emergency Medicine

## 2015-11-09 DIAGNOSIS — F1110 Opioid abuse, uncomplicated: Secondary | ICD-10-CM

## 2015-11-09 DIAGNOSIS — G8929 Other chronic pain: Secondary | ICD-10-CM

## 2015-11-09 DIAGNOSIS — R51 Headache: Secondary | ICD-10-CM | POA: Insufficient documentation

## 2015-11-09 DIAGNOSIS — Z9119 Patient's noncompliance with other medical treatment and regimen: Secondary | ICD-10-CM

## 2015-11-09 DIAGNOSIS — R112 Nausea with vomiting, unspecified: Secondary | ICD-10-CM | POA: Insufficient documentation

## 2015-11-09 DIAGNOSIS — Z91199 Patient's noncompliance with other medical treatment and regimen due to unspecified reason: Secondary | ICD-10-CM

## 2015-11-09 MED ORDER — MAGNESIUM SULFATE IN D5W 10-5 MG/ML-% IV SOLN
1.0000 g | Freq: Once | INTRAVENOUS | Status: DC
Start: 2015-11-09 — End: 2015-11-09

## 2015-11-09 MED ORDER — LORAZEPAM 2 MG/ML IJ SOLN
0.5000 mg | Freq: Once | INTRAMUSCULAR | Status: DC
Start: 2015-11-09 — End: 2015-11-09

## 2015-11-09 MED ORDER — DEXAMETHASONE SODIUM PHOSPHATE 4 MG/ML IJ SOLN (WRAP)
10.0000 mg | Freq: Once | INTRAMUSCULAR | Status: AC
Start: 2015-11-09 — End: 2015-11-09
  Administered 2015-11-09: 10 mg via ORAL
  Filled 2015-11-09: qty 3

## 2015-11-09 MED ORDER — PROMETHAZINE HCL 25 MG/ML IJ SOLN
12.5000 mg | Freq: Once | INTRAMUSCULAR | Status: DC
Start: 2015-11-09 — End: 2015-11-09

## 2015-11-09 MED ORDER — SODIUM CHLORIDE 0.9 % IV BOLUS
1000.0000 mL | Freq: Once | INTRAVENOUS | Status: DC
Start: 2015-11-09 — End: 2015-11-09

## 2015-11-09 MED ORDER — LORAZEPAM 2 MG/ML IJ SOLN
1.0000 mg | Freq: Once | INTRAMUSCULAR | Status: AC
Start: 2015-11-09 — End: 2015-11-09
  Administered 2015-11-09: 1 mg via INTRAMUSCULAR
  Filled 2015-11-09: qty 1

## 2015-11-09 MED ORDER — DEXAMETHASONE SODIUM PHOSPHATE 4 MG/ML IJ SOLN (WRAP)
10.0000 mg | Freq: Once | INTRAMUSCULAR | Status: DC
Start: 2015-11-09 — End: 2015-11-09

## 2015-11-09 NOTE — ED Notes (Signed)
P t with H/A x 3 says and has become worse with sensitivity to light and vomiting. Pt with recent botox med and has H/A after
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
## 2015-11-09 NOTE — Discharge Instructions (Signed)
Headache     You have been treated for a headache.     Headaches are very common. Most of the time they are benign (not harmful). Some headaches can be very serious. Your headache appears to be benign. The doctor feels it is OK for you to go home.     If you continue to have headaches, or if this headache does not resolve over the next few days, you should be evaluated by your regular doctor or a neurologist. Keep a “headache diary.” This may help your doctor learn the cause of your headaches.     Take your headache medication as directed. This is especially important if your doctor has placed you on a daily medication to prevent headaches.     YOU SHOULD SEEK MEDICAL ATTENTION IMMEDIATELY, EITHER HERE OR AT THE NEAREST EMERGENCY DEPARTMENT, IF ANY OF THE FOLLOWING OCCURS:  · Your headache gets worse.  · You have a severe headache that occurs suddenly.  · Your head pain is different from your normal headache.  · You have a fever (temperature higher than 100.4ºF / 38ºC), especially with a stiff neck.  · You feel numbness, tingling, or weakness in your arms or legs.  · You pass out.  · You have problems with your vision.  · You vomit and have trouble taking medication or keeping it down.
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
## 2016-01-07 ENCOUNTER — Encounter: Payer: Self-pay | Admitting: Family Medicine

## 2016-01-07 ENCOUNTER — Inpatient Hospital Stay
Admission: AD | Admit: 2016-01-07 | Discharge: 2016-01-11 | DRG: 392 | Disposition: A | Discharge status: Home / Self Care | Payer: Medicare Other | Source: Other Acute Inpatient Hospital | Attending: Internal Medicine | Admitting: Internal Medicine

## 2016-01-07 ENCOUNTER — Observation Stay: Admit: 2016-01-07 | Payer: Self-pay

## 2016-01-07 ENCOUNTER — Emergency Department: Admission: EM | Admit: 2016-01-07 | Payer: Self-pay | Admitting: Internal Medicine

## 2016-01-07 ENCOUNTER — Emergency Department: Payer: Medicare Other

## 2016-01-07 ENCOUNTER — Encounter: Payer: Self-pay | Admitting: Anesthesiology

## 2016-01-07 ENCOUNTER — Emergency Department
Admission: EM | Admit: 2016-01-07 | Discharge: 2016-01-07 | Disposition: A | Discharge status: Home / Self Care | Payer: Medicare Other | Attending: Emergency Medicine | Admitting: Emergency Medicine

## 2016-01-07 ENCOUNTER — Inpatient Hospital Stay: Payer: Medicare Other | Admitting: Internal Medicine

## 2016-01-07 DIAGNOSIS — Z888 Allergy status to other drugs, medicaments and biological substances status: Secondary | ICD-10-CM | POA: Insufficient documentation

## 2016-01-07 DIAGNOSIS — F4310 Post-traumatic stress disorder, unspecified: Secondary | ICD-10-CM | POA: Diagnosis present

## 2016-01-07 DIAGNOSIS — Z886 Allergy status to analgesic agent status: Secondary | ICD-10-CM | POA: Insufficient documentation

## 2016-01-07 DIAGNOSIS — G43909 Migraine, unspecified, not intractable, without status migrainosus: Secondary | ICD-10-CM | POA: Diagnosis present

## 2016-01-07 DIAGNOSIS — G8929 Other chronic pain: Secondary | ICD-10-CM | POA: Diagnosis present

## 2016-01-07 DIAGNOSIS — R1110 Vomiting, unspecified: Secondary | ICD-10-CM

## 2016-01-07 DIAGNOSIS — K3184 Gastroparesis: Secondary | ICD-10-CM | POA: Insufficient documentation

## 2016-01-07 DIAGNOSIS — S069XAA Unspecified intracranial injury with loss of consciousness status unknown, initial encounter: Secondary | ICD-10-CM | POA: Diagnosis present

## 2016-01-07 DIAGNOSIS — K861 Other chronic pancreatitis: Secondary | ICD-10-CM | POA: Diagnosis present

## 2016-01-07 DIAGNOSIS — R1084 Generalized abdominal pain: Secondary | ICD-10-CM | POA: Insufficient documentation

## 2016-01-07 DIAGNOSIS — G40209 Localization-related (focal) (partial) symptomatic epilepsy and epileptic syndromes with complex partial seizures, not intractable, without status epilepticus: Secondary | ICD-10-CM | POA: Diagnosis present

## 2016-01-07 DIAGNOSIS — Z87820 Personal history of traumatic brain injury: Secondary | ICD-10-CM | POA: Insufficient documentation

## 2016-01-07 DIAGNOSIS — K2270 Barrett's esophagus without dysplasia: Secondary | ICD-10-CM | POA: Diagnosis present

## 2016-01-07 DIAGNOSIS — K30 Functional dyspepsia: Secondary | ICD-10-CM | POA: Diagnosis present

## 2016-01-07 DIAGNOSIS — R109 Unspecified abdominal pain: Secondary | ICD-10-CM | POA: Diagnosis present

## 2016-01-07 DIAGNOSIS — I2699 Other pulmonary embolism without acute cor pulmonale: Secondary | ICD-10-CM | POA: Diagnosis present

## 2016-01-07 DIAGNOSIS — K311 Adult hypertrophic pyloric stenosis: Secondary | ICD-10-CM | POA: Diagnosis present

## 2016-01-07 DIAGNOSIS — E860 Dehydration: Secondary | ICD-10-CM | POA: Diagnosis present

## 2016-01-07 DIAGNOSIS — K429 Umbilical hernia without obstruction or gangrene: Secondary | ICD-10-CM | POA: Insufficient documentation

## 2016-01-07 DIAGNOSIS — Z880 Allergy status to penicillin: Secondary | ICD-10-CM | POA: Insufficient documentation

## 2016-01-07 DIAGNOSIS — R197 Diarrhea, unspecified: Secondary | ICD-10-CM | POA: Diagnosis present

## 2016-01-07 DIAGNOSIS — Z87891 Personal history of nicotine dependence: Secondary | ICD-10-CM

## 2016-01-07 DIAGNOSIS — Z86711 Personal history of pulmonary embolism: Secondary | ICD-10-CM

## 2016-01-07 DIAGNOSIS — R112 Nausea with vomiting, unspecified: Secondary | ICD-10-CM | POA: Diagnosis present

## 2016-01-07 DIAGNOSIS — K2900 Acute gastritis without bleeding: Principal | ICD-10-CM | POA: Diagnosis present

## 2016-01-07 DIAGNOSIS — K219 Gastro-esophageal reflux disease without esophagitis: Secondary | ICD-10-CM | POA: Diagnosis present

## 2016-01-07 DIAGNOSIS — Z95810 Presence of automatic (implantable) cardiac defibrillator: Secondary | ICD-10-CM

## 2016-01-07 DIAGNOSIS — Z9889 Other specified postprocedural states: Secondary | ICD-10-CM

## 2016-01-07 DIAGNOSIS — S069X9A Unspecified intracranial injury with loss of consciousness of unspecified duration, initial encounter: Secondary | ICD-10-CM | POA: Diagnosis present

## 2016-01-07 DIAGNOSIS — K660 Peritoneal adhesions (postprocedural) (postinfection): Secondary | ICD-10-CM | POA: Diagnosis present

## 2016-01-07 DIAGNOSIS — IMO0002 Reserved for concepts with insufficient information to code with codable children: Secondary | ICD-10-CM

## 2016-01-07 DIAGNOSIS — K920 Hematemesis: Secondary | ICD-10-CM | POA: Diagnosis not present

## 2016-01-07 HISTORY — DX: Intussusception: K56.1

## 2016-01-07 HISTORY — DX: Migraine, unspecified, not intractable, without status migrainosus: G43.909

## 2016-01-07 LAB — CBC AND DIFFERENTIAL
Basophils Absolute Automated: 0.03 10*3/uL (ref 0.00–0.20)
Basophils Automated: 0 %
Eosinophils Absolute Automated: 0.13 10*3/uL (ref 0.00–0.70)
Eosinophils Automated: 2 %
Hematocrit: 44.4 % (ref 42.0–52.0)
Hgb: 15.1 g/dL (ref 13.0–17.0)
Lymphocytes Absolute Automated: 1.43 10*3/uL (ref 0.50–4.40)
Lymphocytes Automated: 25 %
MCH: 29.2 pg (ref 28.0–32.0)
MCHC: 34 g/dL (ref 32.0–36.0)
MCV: 85.7 fL (ref 80.0–100.0)
MPV: 11.1 fL (ref 9.4–12.3)
Monocytes Absolute Automated: 0.54 10*3/uL (ref 0.00–1.20)
Monocytes: 9 %
Neutrophils Absolute: 3.59 10*3/uL (ref 1.80–8.10)
Neutrophils: 63 %
Platelets: 251 10*3/uL (ref 140–400)
RBC: 5.18 10*6/uL (ref 4.70–6.00)
RDW: 12 % (ref 12–15)
WBC: 5.72 10*3/uL (ref 3.50–10.80)

## 2016-01-07 LAB — COMPREHENSIVE METABOLIC PANEL
ALT: 29 U/L (ref 0–55)
AST (SGOT): 21 U/L (ref 5–34)
Albumin/Globulin Ratio: 1.8 (ref 0.9–2.2)
Albumin: 4.4 g/dL (ref 3.5–5.0)
Alkaline Phosphatase: 85 U/L (ref 38–106)
Anion Gap: 14 (ref 5.0–15.0)
BUN: 12 mg/dL (ref 9.0–28.0)
Bilirubin, Total: 0.4 mg/dL (ref 0.2–1.2)
CO2: 21 mEq/L — ABNORMAL LOW (ref 22–29)
Calcium: 9.6 mg/dL (ref 8.5–10.5)
Chloride: 106 mEq/L (ref 100–111)
Creatinine: 0.9 mg/dL (ref 0.7–1.3)
Globulin: 2.5 g/dL (ref 2.0–3.6)
Glucose: 84 mg/dL (ref 70–100)
Potassium: 4.9 mEq/L (ref 3.5–5.1)
Protein, Total: 6.9 g/dL (ref 6.0–8.3)
Sodium: 141 mEq/L (ref 136–145)

## 2016-01-07 LAB — URINALYSIS
Bilirubin, UA: NEGATIVE
Blood, UA: NEGATIVE
Glucose, UA: NEGATIVE
Ketones UA: NEGATIVE
Nitrite, UA: NEGATIVE
Protein, UR: NEGATIVE
Specific Gravity UA: 1.025 (ref 1.001–1.035)
Urine pH: 5 (ref 5.0–8.0)
Urobilinogen, UA: <2 mg/dL (ref 0.2–2.0)

## 2016-01-07 LAB — ETHANOL: Alcohol: NOT DETECTED mg/dL

## 2016-01-07 LAB — URINE MICROSCOPIC

## 2016-01-07 LAB — LIPASE: Lipase: 16 U/L (ref 8–78)

## 2016-01-07 LAB — RAPID DRUG SCREEN, URINE
Barbiturate Screen, UR: POSITIVE — AB
Benzodiazepine Screen, UR: POSITIVE — AB
Cannabinoid Screen, UR: NEGATIVE
Cocaine, UR: NEGATIVE
Opiate Screen, UR: POSITIVE — AB
PCP Screen, UR: NEGATIVE
Urine Amphetamine Screen: NEGATIVE

## 2016-01-07 LAB — PT/INR
PT INR: 1 (ref 0.9–1.1)
PT: 13.5 s (ref 12.6–15.0)

## 2016-01-07 LAB — GFR: EGFR: >60

## 2016-01-07 LAB — HEMOLYSIS INDEX: Hemolysis Index: 106 — ABNORMAL HIGH (ref 0–18)

## 2016-01-07 LAB — PHOSPHORUS: Phosphorus: 3.4 mg/dL (ref 2.3–4.7)

## 2016-01-07 LAB — APTT: PTT: 34 s (ref 23–37)

## 2016-01-07 LAB — MAGNESIUM: Magnesium: 1.9 mg/dL (ref 1.6–2.6)

## 2016-01-07 MED ORDER — HYDROMORPHONE HCL 1 MG/ML IJ SOLN
2.0000 mg | Freq: Once | INTRAMUSCULAR | Status: AC
Start: 2016-01-07 — End: 2016-01-07
  Administered 2016-01-07: 2 mg via INTRAVENOUS
  Filled 2016-01-07: qty 2

## 2016-01-07 MED ORDER — DEXTROSE-SODIUM CHLORIDE 5-0.9 % IV SOLN
INTRAVENOUS | Status: DC
Start: 2016-01-07 — End: 2016-01-11

## 2016-01-07 MED ORDER — DIPHENHYDRAMINE HCL 50 MG/ML IJ SOLN
25.0000 mg | Freq: Once | INTRAMUSCULAR | Status: AC
Start: 2016-01-07 — End: 2016-01-07
  Administered 2016-01-07: 25 mg via INTRAVENOUS
  Filled 2016-01-07: qty 1

## 2016-01-07 MED ORDER — CYCLOBENZAPRINE HCL 10 MG PO TABS
10.0000 mg | ORAL_TABLET | Freq: Three times a day (TID) | ORAL | Status: DC | PRN
Start: 2016-01-07 — End: 2016-01-11
  Administered 2016-01-09: 10 mg via ORAL
  Filled 2016-01-07: qty 1

## 2016-01-07 MED ORDER — HYDROMORPHONE PCA 0.2 MG/ML 100 ML (OUTSOURCED)
INTRAVENOUS | Status: DC
Start: 2016-01-07 — End: 2016-01-09
  Administered 2016-01-07 – 2016-01-09 (×4): 20 mg via INTRAVENOUS
  Filled 2016-01-07 (×4): qty 100

## 2016-01-07 MED ORDER — KETAMINE HCL 50 MG/ML IJ SOLN
0.4000 mg/kg | Freq: Once | INTRAMUSCULAR | Status: AC
Start: 2016-01-07 — End: 2016-01-07
  Administered 2016-01-07: 35 mg via INTRAVENOUS
  Filled 2016-01-07: qty 10

## 2016-01-07 MED ORDER — ONDANSETRON HCL 4 MG/2ML IJ SOLN
4.0000 mg | Freq: Once | INTRAMUSCULAR | Status: AC
Start: 2016-01-07 — End: 2016-01-07
  Administered 2016-01-07: 4 mg via INTRAVENOUS
  Filled 2016-01-07: qty 2

## 2016-01-07 MED ORDER — LEVETIRACETAM 500 MG/5ML IV SOLN
500.0000 mg | Freq: Once | INTRAVENOUS | Status: AC
Start: 2016-01-07 — End: 2016-01-07
  Administered 2016-01-07: 500 mg via INTRAVENOUS
  Filled 2016-01-07: qty 5

## 2016-01-07 MED ORDER — NYSTATIN 100000 UNIT/GM EX OINT
TOPICAL_OINTMENT | Freq: Two times a day (BID) | CUTANEOUS | Status: DC
Start: 2016-01-07 — End: 2016-01-11
  Filled 2016-01-07: qty 15

## 2016-01-07 MED ORDER — ZOLPIDEM TARTRATE 5 MG PO TABS
10.0000 mg | ORAL_TABLET | Freq: Every evening | ORAL | Status: DC | PRN
Start: 2016-01-07 — End: 2016-01-11

## 2016-01-07 MED ORDER — PROMETHAZINE HCL 25 MG/ML IJ SOLN
12.5000 mg | Freq: Once | INTRAMUSCULAR | Status: DC
Start: 2016-01-07 — End: 2016-01-07

## 2016-01-07 MED ORDER — ENOXAPARIN SODIUM 100 MG/ML SC SOLN
1.0000 mg/kg | Freq: Two times a day (BID) | SUBCUTANEOUS | Status: DC
Start: 2016-01-07 — End: 2016-01-08
  Administered 2016-01-07: 90 mg via SUBCUTANEOUS
  Filled 2016-01-07: qty 1

## 2016-01-07 MED ORDER — CLONAZEPAM 1 MG PO TABS
1.0000 mg | ORAL_TABLET | Freq: Two times a day (BID) | ORAL | Status: DC | PRN
Start: 2016-01-07 — End: 2016-01-11
  Administered 2016-01-09 – 2016-01-10 (×2): 1 mg via ORAL
  Filled 2016-01-07 (×2): qty 1

## 2016-01-07 MED ORDER — NALOXONE HCL 0.4 MG/ML IJ SOLN (WRAP)
0.2000 mg | INTRAMUSCULAR | Status: DC | PRN
Start: 2016-01-07 — End: 2016-01-11

## 2016-01-07 MED ORDER — NALOXONE HCL 0.4 MG/ML IJ SOLN (WRAP)
0.1000 mg | INTRAMUSCULAR | Status: DC | PRN
Start: 2016-01-07 — End: 2016-01-09

## 2016-01-07 MED ORDER — PROMETHAZINE HCL 25 MG/ML IJ SOLN
12.5000 mg | Freq: Once | INTRAMUSCULAR | Status: AC
Start: 2016-01-07 — End: 2016-01-07
  Administered 2016-01-07: 12.5 mg via INTRAVENOUS
  Filled 2016-01-07: qty 1

## 2016-01-07 MED ORDER — ONDANSETRON 4 MG PO TBDP
8.0000 mg | ORAL_TABLET | ORAL | Status: DC | PRN
Start: 2016-01-07 — End: 2016-01-09
  Administered 2016-01-07 – 2016-01-09 (×2): 8 mg via ORAL
  Filled 2016-01-07 (×2): qty 2

## 2016-01-07 MED ORDER — SODIUM CHLORIDE 0.9 % IV SOLN
750.0000 mg | Freq: Two times a day (BID) | INTRAVENOUS | Status: DC
Start: 2016-01-07 — End: 2016-01-11
  Administered 2016-01-07 – 2016-01-11 (×9): 750 mg via INTRAVENOUS
  Filled 2016-01-07 (×10): qty 7.5

## 2016-01-07 MED ORDER — DIAZEPAM 5 MG/ML IJ SOLN
5.0000 mg | Freq: Once | INTRAMUSCULAR | Status: DC
Start: 2016-01-07 — End: 2016-01-07

## 2016-01-07 MED ORDER — ACETAMINOPHEN 650 MG RE SUPP
650.0000 mg | RECTAL | Status: DC | PRN
Start: 2016-01-07 — End: 2016-01-11

## 2016-01-07 MED ORDER — LORAZEPAM 2 MG/ML IJ SOLN
1.0000 mg | Freq: Once | INTRAMUSCULAR | Status: AC
Start: 2016-01-07 — End: 2016-01-07
  Administered 2016-01-07: 1 mg via INTRAVENOUS
  Filled 2016-01-07: qty 1

## 2016-01-07 MED ORDER — DOCUSATE SODIUM 100 MG PO CAPS
100.0000 mg | ORAL_CAPSULE | Freq: Every day | ORAL | Status: DC
Start: 2016-01-07 — End: 2016-01-09
  Administered 2016-01-07 – 2016-01-08 (×2): 100 mg via ORAL
  Filled 2016-01-07 (×2): qty 1

## 2016-01-07 MED ORDER — ONDANSETRON HCL 4 MG/2ML IJ SOLN
4.0000 mg | INTRAMUSCULAR | Status: DC | PRN
Start: 2016-01-07 — End: 2016-01-09
  Administered 2016-01-07 – 2016-01-09 (×3): 4 mg via INTRAVENOUS
  Filled 2016-01-07 (×4): qty 2

## 2016-01-07 MED ORDER — ACETAMINOPHEN 325 MG PO TABS
650.0000 mg | ORAL_TABLET | ORAL | Status: DC | PRN
Start: 2016-01-07 — End: 2016-01-11

## 2016-01-07 MED ORDER — DIPHENHYDRAMINE HCL 50 MG/ML IJ SOLN
25.0000 mg | Freq: Four times a day (QID) | INTRAMUSCULAR | Status: DC | PRN
Start: 2016-01-07 — End: 2016-01-08
  Administered 2016-01-07 – 2016-01-08 (×6): 25 mg via INTRAVENOUS
  Filled 2016-01-07 (×6): qty 1

## 2016-01-07 MED ORDER — ALTEPLASE 2 MG IJ SOLR
2.0000 mg | Freq: Once | INTRAMUSCULAR | Status: AC
Start: 2016-01-07 — End: 2016-01-07
  Administered 2016-01-07: 2 mg
  Filled 2016-01-07: qty 2

## 2016-01-07 MED ORDER — HYDROMORPHONE HCL 1 MG/ML IJ SOLN
2.0000 mg | INTRAMUSCULAR | Status: DC | PRN
Start: 2016-01-07 — End: 2016-01-07
  Administered 2016-01-07: 2 mg via INTRAVENOUS
  Filled 2016-01-07: qty 2

## 2016-01-07 MED ORDER — SODIUM CHLORIDE 0.9 % IV BOLUS
1000.0000 mL | Freq: Once | INTRAVENOUS | Status: AC
Start: 2016-01-07 — End: 2016-01-07
  Administered 2016-01-07: 1000 mL via INTRAVENOUS

## 2016-01-07 MED ORDER — HEPARIN SOD (PORK) LOCK FLUSH 100 UNIT/ML IV SOLN
INTRAVENOUS | Status: AC
Start: 2016-01-07 — End: 2016-01-07
  Administered 2016-01-07: 500 [IU]
  Filled 2016-01-07: qty 5

## 2016-01-07 MED ORDER — HYDROMORPHONE HCL 1 MG/ML IJ SOLN
4.0000 mg | Freq: Once | INTRAMUSCULAR | Status: AC
Start: 2016-01-07 — End: 2016-01-07
  Administered 2016-01-07: 4 mg via INTRAVENOUS
  Filled 2016-01-07: qty 4

## 2016-01-07 MED ORDER — DIAZEPAM 5 MG/ML IJ SOLN
10.0000 mg | Freq: Once | INTRAMUSCULAR | Status: AC
Start: 2016-01-07 — End: 2016-01-07
  Administered 2016-01-07: 10 mg via INTRAVENOUS
  Filled 2016-01-07: qty 2

## 2016-01-07 MED ORDER — KETAMINE HCL 50 MG/ML IJ SOLN
0.4000 mg/kg | Freq: Once | INTRAMUSCULAR | Status: DC
Start: 2016-01-07 — End: 2016-01-07

## 2016-01-07 MED ORDER — TOPIRAMATE 100 MG PO TABS
400.0000 mg | ORAL_TABLET | Freq: Two times a day (BID) | ORAL | Status: DC
Start: 2016-01-07 — End: 2016-01-07

## 2016-01-07 MED ORDER — SODIUM CHLORIDE 0.9 % IV SOLN
8.0000 mg/h | INTRAVENOUS | Status: DC
Start: 2016-01-07 — End: 2016-01-11
  Administered 2016-01-07 – 2016-01-11 (×5): 8 mg/h via INTRAVENOUS
  Filled 2016-01-07 (×5): qty 80

## 2016-01-07 MED ORDER — PROMETHAZINE HCL 25 MG/ML IJ SOLN
12.5000 mg | Freq: Four times a day (QID) | INTRAMUSCULAR | Status: DC | PRN
Start: 2016-01-07 — End: 2016-01-09
  Administered 2016-01-07 – 2016-01-09 (×9): 12.5 mg via INTRAVENOUS
  Filled 2016-01-07 (×10): qty 1

## 2016-01-07 MED ORDER — HYDROMORPHONE HCL 1 MG/ML IJ SOLN
1.0000 mg | Freq: Once | INTRAMUSCULAR | Status: AC
Start: 2016-01-07 — End: 2016-01-07
  Administered 2016-01-07: 1 mg via INTRAVENOUS
  Filled 2016-01-07: qty 1

## 2016-01-07 MED ORDER — TRAMADOL HCL 50 MG PO TABS
50.0000 mg | ORAL_TABLET | Freq: Four times a day (QID) | ORAL | Status: DC | PRN
Start: 2016-01-07 — End: 2016-01-07

## 2016-01-07 MED ORDER — LORAZEPAM 2 MG/ML IJ SOLN
1.0000 mg | Freq: Once | INTRAMUSCULAR | Status: DC
Start: 2016-01-07 — End: 2016-01-07

## 2016-01-07 MED ORDER — ENOXAPARIN SODIUM 100 MG/ML SC SOLN
90.0000 mg | Freq: Two times a day (BID) | SUBCUTANEOUS | Status: DC
Start: 2016-01-07 — End: 2016-01-07

## 2016-01-07 MED ORDER — ONDANSETRON HCL 4 MG/2ML IJ SOLN
4.0000 mg | INTRAMUSCULAR | Status: DC | PRN
Start: 2016-01-07 — End: 2016-01-09
  Administered 2016-01-08: 4 mg via INTRAVENOUS

## 2016-01-07 MED ORDER — DIPHENHYDRAMINE HCL 25 MG PO CAPS
50.0000 mg | ORAL_CAPSULE | Freq: Four times a day (QID) | ORAL | Status: DC | PRN
Start: 2016-01-07 — End: 2016-01-08
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
## 2016-01-07 NOTE — Progress Notes (Signed)
Pt admitted from [facility], into room 2107B. Pt alert and oriented x 4, pt accompanied by service dog to detect pt's seizure. Pt is oriented to the room, call bell and plan. Pt verbalized understanding. Pt is wearing non-skid foot wear. Pt c/o abdominal pain, on PCA dilaudid, c/o N and itching, nausea medication, and benadryl given as ordered. R chest Mediport not given blood return, Cath flow ordered and still no blood return. Report given to night shift nurse to try 2nd cath flow.
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
## 2016-01-07 NOTE — ED Notes (Signed)
Pt states that for the past two weeks he has had worsening abdominal pain with diarrhea.  Pt states that for the past 3 days he has been vomiting and not able to keep anything down.
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
## 2016-01-07 NOTE — ED Notes (Signed)
Pt requested more pain meds, the meds orderded are not available. Pt requested to speak with Dr. Nonda Lou. Dr Nonda Lou will update pt on his admission status and plan of care.

## 2016-01-07 NOTE — Progress Notes (Signed)
30 Y/O patient with past medical history significant for traumatic brain injury, hx of multiple abdominal surgeries, currently patient complaining of severe abdominal pain that is not adequately controlled with PRN dilaudid IV. Plan to start patient on Dilaudid PCA: contin: 0, demand dose: 0.5 mg, lockout time 7 min, 1 hour demand dose 8. Will continue to follow up.

## 2016-01-07 NOTE — Consults (Signed)
Progress Note    Date Time: 01/07/2016 10:38 AM  Patient Name: Erik Wang, Erik Wang.  Attending Physician: Dorian Heckle, MD      Assessment & Plan:   30 yo male admitted with abdominal pain, emesis, diarrhea - unable to keep his medications down.  Neurology asked to comment on his seizure meds.  Pt with hx seizures, which he says are in fact associated with his migraines.  He is on Topamax 200mg  twice a day and Keppra XR 500mg  per day.  He has missed a few doses already due to nausea/vomiting and is worried he will have a seizure.    -  [place] Topamax until he is able to take med by mouth.  -  Increase Keppra to 750mg  Q12 IV to cover him for seizure prophylaxis  -  Of note, pt is on ultram at home - this can lower the seizure threshold.    Review of Systems:   No headache, eye, ear nose, throat problems; no coughing or wheezing or shortness of breath, No chest pain or orthopnea, no abdominal pain, nausea or vomiting, No pain in the body or extremities, no psychiatric, neurological, endocrine, hematological or cardiac complaints except as noted above.     Physical Exam:   Blood pressure 146/90, pulse 81, temperature 97.8 F (36.6 C), temperature source Oral, resp. rate 18, SpO2 98 %.    Alert  Somewhat sleepy appearing  Follows simple commands.      Meds:      Scheduled Meds: PRN Meds:        Continuous Infusions:           I personally reviewed all of the medications    Labs:     Recent Labs  Lab 01/07/16  0156   GLUCOSE 84   BUN 12.0   CREATININE 0.9   CALCIUM 9.6   SODIUM 141   POTASSIUM 4.9   CHLORIDE 106   CO2 21*   ALBUMIN 4.4   AST (SGOT) 21   ALT 29   BILIRUBIN, TOTAL 0.4   ALKALINE PHOSPHATASE 85       Recent Labs  Lab 01/07/16  0200   WBC 5.72   HGB 15.1   HEMATOCRIT 44.4   MCV 85.7   MCH 29.2   MCHC 34.0   PLATELETS 251         No results for input(s): PTT, PT, INR in the last 72 hours.       Radiology Results (24 Hour)     ** No results found for the last 24 hours. **           All recent brain and  spine imaging (MRI, CT) personally reviewed.    Chart reviewed    Case discussed with: Dr. Ambrose Pancoast    20 minutes; >50% time spent in counseling or coordination of care    Signed by: Ardelle Anton, MD  Spectralink: [phone]       Answering Service: [phone]

## 2016-01-08 ENCOUNTER — Ambulatory Visit: Payer: Self-pay

## 2016-01-08 ENCOUNTER — Encounter
Admission: AD | Disposition: A | Discharge status: Home / Self Care | Payer: Self-pay | Source: Other Acute Inpatient Hospital | Attending: Internal Medicine

## 2016-01-08 ENCOUNTER — Observation Stay: Payer: Medicare Other | Admitting: Anesthesiology

## 2016-01-08 DIAGNOSIS — K2970 Gastritis, unspecified, without bleeding: Secondary | ICD-10-CM

## 2016-01-08 HISTORY — PX: EGD, BIOPSY: SHX3796

## 2016-01-08 LAB — CBC
Hematocrit: 38 % — ABNORMAL LOW (ref 42.0–52.0)
Hgb: 13.1 g/dL (ref 13.0–17.0)
MCH: 29.8 pg (ref 28.0–32.0)
MCHC: 34.5 g/dL (ref 32.0–36.0)
MCV: 86.6 fL (ref 80.0–100.0)
MPV: 9.9 fL (ref 9.4–12.3)
Nucleated RBC: 0 /100 WBC (ref 0–1)
Platelets: 198 10*3/uL (ref 140–400)
RBC: 4.39 10*6/uL — ABNORMAL LOW (ref 4.70–6.00)
RDW: 12 % (ref 12–15)
WBC: 4.68 10*3/uL (ref 3.50–10.80)

## 2016-01-08 LAB — GFR: EGFR: >60

## 2016-01-08 LAB — COMPREHENSIVE METABOLIC PANEL
ALT: 22 U/L (ref 0–55)
AST (SGOT): 17 U/L (ref 5–34)
Albumin/Globulin Ratio: 1.7 (ref 0.9–2.2)
Albumin: 3.6 g/dL (ref 3.5–5.0)
Alkaline Phosphatase: 66 U/L (ref 38–106)
Anion Gap: 6 (ref 5.0–15.0)
BUN: 10 mg/dL (ref 9.0–28.0)
Bilirubin, Total: 0.2 mg/dL (ref 0.2–1.2)
CO2: 26 mEq/L (ref 22–29)
Calcium: 8.3 mg/dL — ABNORMAL LOW (ref 8.5–10.5)
Chloride: 109 mEq/L (ref 100–111)
Creatinine: 1.1 mg/dL (ref 0.7–1.3)
Globulin: 2.1 g/dL (ref 2.0–3.6)
Glucose: 116 mg/dL — ABNORMAL HIGH (ref 70–100)
Potassium: 4.1 mEq/L (ref 3.5–5.1)
Protein, Total: 5.7 g/dL — ABNORMAL LOW (ref 6.0–8.3)
Sodium: 141 mEq/L (ref 136–145)

## 2016-01-08 LAB — HEMOLYSIS INDEX: Hemolysis Index: 8 (ref 0–18)

## 2016-01-08 SURGERY — ESOPHAGOGASTRODUODENOSCOPY (EGD), BIOPSY
Anesthesia: Anesthesia General | Site: Abdomen | Wound class: Clean Contaminated

## 2016-01-08 MED ORDER — DIPHENHYDRAMINE HCL 25 MG PO CAPS
50.0000 mg | ORAL_CAPSULE | Freq: Four times a day (QID) | ORAL | Status: DC | PRN
Start: 2016-01-08 — End: 2016-01-11

## 2016-01-08 MED ORDER — HYDROMORPHONE HCL 1 MG/ML IJ SOLN
INTRAMUSCULAR | Status: AC
Start: 2016-01-08 — End: ?
  Filled 2016-01-08: qty 1

## 2016-01-08 MED ORDER — SUCCINYLCHOLINE CHLORIDE 20 MG/ML IJ SOLN
INTRAMUSCULAR | Status: AC
Start: 2016-01-08 — End: ?
  Filled 2016-01-08: qty 10

## 2016-01-08 MED ORDER — KETOROLAC TROMETHAMINE 15 MG/ML IJ SOLN
15.0000 mg | Freq: Once | INTRAMUSCULAR | Status: DC
Start: 2016-01-08 — End: 2016-01-11

## 2016-01-08 MED ORDER — PROPOFOL 10 MG/ML IV EMUL (WRAP)
INTRAVENOUS | Status: DC | PRN
Start: 2016-01-08 — End: 2016-01-08
  Administered 2016-01-08: 200 mg via INTRAVENOUS

## 2016-01-08 MED ORDER — PROMETHAZINE HCL 25 MG/ML IJ SOLN
12.5000 mg | Freq: Once | INTRAMUSCULAR | Status: AC
Start: 2016-01-08 — End: 2016-01-08
  Administered 2016-01-08: 12.5 mg via INTRAVENOUS
  Filled 2016-01-08: qty 1

## 2016-01-08 MED ORDER — DIPHENHYDRAMINE HCL 50 MG/ML IJ SOLN
12.5000 mg | Freq: Once | INTRAMUSCULAR | Status: AC
Start: 2016-01-08 — End: 2016-01-08
  Administered 2016-01-08: 12.5 mg via INTRAVENOUS
  Filled 2016-01-08: qty 1

## 2016-01-08 MED ORDER — MAGNESIUM SULFATE IN D5W 10-5 MG/ML-% IV SOLN
1.0000 g | Freq: Once | INTRAVENOUS | Status: AC
Start: 2016-01-08 — End: 2016-01-08
  Administered 2016-01-08: 1 g via INTRAVENOUS
  Filled 2016-01-08: qty 100

## 2016-01-08 MED ORDER — ONABOTULINUMTOXINA 100 UNITS IJ SOLR
100.0000 [IU] | Freq: Once | INTRAMUSCULAR | Status: AC
Start: 2016-01-08 — End: 2016-01-08
  Administered 2016-01-08: 100 [IU]
  Filled 2016-01-08: qty 100

## 2016-01-08 MED ORDER — DIPHENHYDRAMINE HCL 50 MG/ML IJ SOLN
25.0000 mg | Freq: Once | INTRAMUSCULAR | Status: AC
Start: 2016-01-08 — End: 2016-01-08
  Administered 2016-01-08: 25 mg via INTRAVENOUS

## 2016-01-08 MED ORDER — PROPOFOL 10 MG/ML IV EMUL (WRAP)
INTRAVENOUS | Status: AC
Start: 2016-01-08 — End: ?
  Filled 2016-01-08: qty 20

## 2016-01-08 MED ORDER — LIDOCAINE HCL (PF) 2 % IJ SOLN
INTRAMUSCULAR | Status: AC
Start: 2016-01-08 — End: ?
  Filled 2016-01-08: qty 5

## 2016-01-08 MED ORDER — PROPOFOL INFUSION 10 MG/ML
INTRAVENOUS | Status: DC | PRN
Start: 2016-01-08 — End: 2016-01-08
  Administered 2016-01-08: 180 ug/kg/min via INTRAVENOUS

## 2016-01-08 MED ORDER — LACTATED RINGERS IV SOLN
INTRAVENOUS | Status: DC | PRN
Start: 2016-01-08 — End: 2016-01-08

## 2016-01-08 MED ORDER — MIDAZOLAM HCL 2 MG/2ML IJ SOLN
INTRAMUSCULAR | Status: AC
Start: 2016-01-08 — End: ?
  Filled 2016-01-08: qty 2

## 2016-01-08 MED ORDER — LIDOCAINE HCL 2 % IJ SOLN
INTRAMUSCULAR | Status: DC | PRN
Start: 2016-01-08 — End: 2016-01-08
  Administered 2016-01-08: 80 mg

## 2016-01-08 MED ORDER — PROPOFOL 10 MG/ML IV EMUL (WRAP)
INTRAVENOUS | Status: AC
Start: 2016-01-08 — End: ?
  Filled 2016-01-08: qty 50

## 2016-01-08 MED ORDER — HYDROMORPHONE HCL 1 MG/ML IJ SOLN
INTRAMUSCULAR | Status: DC | PRN
Start: 2016-01-08 — End: 2016-01-08
  Administered 2016-01-08: 1 mg via INTRAVENOUS

## 2016-01-08 MED ORDER — MIDAZOLAM HCL 2 MG/2ML IJ SOLN
INTRAMUSCULAR | Status: DC | PRN
Start: 2016-01-08 — End: 2016-01-08
  Administered 2016-01-08: 2 mg via INTRAVENOUS

## 2016-01-08 MED ORDER — DIPHENHYDRAMINE HCL 50 MG/ML IJ SOLN
12.5000 mg | INTRAMUSCULAR | Status: DC | PRN
Start: 2016-01-08 — End: 2016-01-11
  Administered 2016-01-08 – 2016-01-11 (×16): 12.5 mg via INTRAVENOUS
  Filled 2016-01-08 (×16): qty 1

## 2016-01-08 MED ORDER — MAGNESIUM SULFATE IN D5W 10-5 MG/ML-% IV SOLN
1.0000 g | Freq: Once | INTRAVENOUS | Status: DC
Start: 2016-01-08 — End: 2016-01-08
  Filled 2016-01-08: qty 100

## 2016-01-08 SURGICAL SUPPLY — 22 items
CANISTER 1000CC (Procedure Accessories) ×2 IMPLANT
FORCEPS BIOPSY L240 CM MICROMESH TEETH STREAMLINE CATHETER NEEDLE (Instrument) IMPLANT
FORCEPS BIOPSY L240 CM STANDARD CAPACITY (Instrument) ×1 IMPLANT
FORCEPS BX STD CPC RJ 4 2.2MM 240CM STRL (Instrument) ×1
GOWN ISO YELLOW UNIVERSAL (Gown) ×4 IMPLANT
KIT UNIVERSAL IRRIGATION SOL (Kits) ×2 IMPLANT
NEEDLE CARR-LOCKE INJECT 25GX5 (Needles) ×1 IMPLANT
RESUSCITATOR MANUAL ADULT MASK 2 SWIVEL ELBOW CORRUGATE O2 TUBE SELF (Respiratory Supplies) IMPLANT
RESUSCITATOR MNL ARLF 40IN MSK 2 SWVL (Respiratory Supplies) ×2 IMPLANT
SOFT-CUF 2T ADULT SUB-MIN (Cuff) ×2 IMPLANT
SOLN LUBRICATING JELLY 4.25OZ (Irrigation Solutions) ×2 IMPLANT
SPONGE GAUZE L4 IN X W4 IN 16 PLY (Dressing) ×10 IMPLANT
SPONGE GAUZE L4 IN X W4 IN 16 PLY MAXIMUM ABSORBENT USP TYPE VII (Dressing) ×10 IMPLANT
SPONGE GZE CTTN CRTY 4X4IN LF NS 16 PLY (Dressing) ×10
SYRINGE 50 ML GRADUATE NONPYROGENIC DEHP (Syringes, Needles) ×1 IMPLANT
SYRINGE 50 ML GRADUATE NONPYROGENIC DEHP FREE PVC FREE BD MEDICAL (Syringes, Needles) ×1 IMPLANT
SYRINGE MED 50ML LF STRL GRAD N-PYRG (Syringes, Needles) ×1
WATER STERILE PLASTIC POUR BOTTLE 1000 (Irrigation Solutions) ×1 IMPLANT
WATER STERILE PLASTIC POUR BOTTLE 1000 ML (Irrigation Solutions) ×1 IMPLANT
WATER STERILE PLASTIC POUR BOTTLE 250 ML (Irrigation Solutions) ×1 IMPLANT
WATER STRL 1000ML LF PLS PR BTL (Irrigation Solutions) ×1
WATER STRL 250ML LF PLS PR BTL (Irrigation Solutions) ×1
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
## 2016-01-08 NOTE — PACU (Signed)
Dr. Karleen Hampshire stated he was okay with low 90 oxygen saturation due to patients PTSD did not want anything extra on his face for wake up. Was able to get patient to cooperate to take bite block out at 0955. Still very sleepy, will continue to monitor.

## 2016-01-08 NOTE — Transfer of Care (Signed)
Anesthesia Transfer of Care Note    Patient: Erik Wang.    Procedures performed: Procedure(s):  EGD, BIOPSY with Dilation possible botox    Anesthesia type: General ETT    Patient location:Phase I PACU    Last vitals:   Filed Vitals:    01/08/16 0935   BP: 117/67   Pulse: 86   Temp: 36.4 C (97.5 F)   Resp: 14   SpO2: 92%       Post pain: Patient not complaining of pain, continue current therapy      Mental Status:sedated    Respiratory Function: tolerating room air    Cardiovascular: stable    Nausea/Vomiting: patient not complaining of nausea or vomiting    Hydration Status: adequate    Post assessment: no apparent anesthetic complications    Signed by: Karie Fetch  01/08/2016 9:35 AM

## 2016-01-08 NOTE — Anesthesia Preprocedure Evaluation (Signed)
Anesthesia Evaluation    AIRWAY    Mallampati: II    TM distance: >3 FB  Neck ROM: full  Mouth Opening:full   CARDIOVASCULAR    cardiovascular exam normal       DENTAL         PULMONARY    pulmonary exam normal     OTHER FINDINGS    PTSD, hx TBI, vomiting Blood, abdominal pain                  Anesthesia Plan    ASA 3 - emergent     general                     Detailed anesthesia plan: general endotracheal
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
## 2016-01-08 NOTE — H&P (Signed)
GI PRE PROCEDURE NOTE    Proceduralist Comments:   Indications:23594 Nausea and Vomiting    Review of Systems and Past Medical / Surgical History performed: Yes     Previous Adverse Reaction to Anesthesia or Sedation (if yes, describe): No    Physical Exam / Laboratory Data (If applicable)   Airway Classification: ClassIII    General: Alert and cooperative  Lungs: Lungs clear to auscultation  Cardiac: RRR, normal S1S2.    Abdomen: Soft, non tender. Normal active bowel sounds  Other:     No labs drawn    American Society of Anesthesiologists (ASA) Physical Status Classification:   ASA 2 - Patient with mild systemic disease with no functional limitations    Planned Sedation:   Deep sedation with anesthesia    Attestation:   Erik Wang. has been reassessed immediately prior to the procedure and is an appropriate candidate for the planned sedation and procedure. Risks, benefits and alternatives to the planned procedure and sedation have been explained to the patient or guardian:  yes        Bland Span, MD  01/08/2016  9:33 AM

## 2016-01-08 NOTE — Plan of Care (Signed)
Problem: Safety  Goal: Patient will be free from injury during hospitalization  Outcome: Progressing  Bed in low position with alarm on, fall mat in place, side rails up, call bell within reach and hourly rounding done.    Problem: Pain  Goal: Patient's pain/discomfort is manageable  Outcome: Progressing  Pt is on PCA dilaudid. Pt is slightly drowsy but easily arousable.    Comments:   Pt s/p EGD with botox injection. Pt had 1 episode of emesis without presence of blood.

## 2016-01-08 NOTE — Progress Notes (Signed)
PROGRESS NOTE:  Anesthesia daily PCA rounding    Date Time: 01/08/2016 11:03 AM  Patient Name: Erik Wang, Erik Wang.           Subjective:    Pain control: pain claims he is hurting because he just had an EGD with botox injection.    Patient reported side effects:  None    Objective:    Sedation scale:  POSS  1-Awake and alert  Patient oriented x  Time , Place  and Person  Vitals: Patient Vitals for the past 12 hrs:   BP Temp Pulse Resp   01/08/16 1025 110/72 mmHg - 70 16   01/08/16 1020 116/69 mmHg - 70 13   01/08/16 1015 108/71 mmHg - 72 14   01/08/16 1010 107/73 mmHg - 74 (!) 10   01/08/16 1005 110/72 mmHg - 78 19   01/08/16 1000 108/70 mmHg - 78 17   01/08/16 0955 110/69 mmHg - 74 16   01/08/16 0950 109/70 mmHg - 78 16   01/08/16 0945 107/70 mmHg - 80 17   01/08/16 0940 108/69 mmHg - 82 18   01/08/16 0935 117/67 mmHg 36.4 C (97.5 F) 86 14   01/08/16 0838 (!) 121/99 mmHg 36.7 C (98 F) 82 18   01/08/16 0816 132/75 mmHg (!) 35.8 C (96.5 F) 89 16   01/08/16 0735 - - - 18   01/08/16 0400 - - - 16   01/08/16 0050 - - - 16       Height: 180.3 cm (5\' 11" )  Weight: 90.719 kg (200 lb)  BMI (calculated): 28    Heart and Lungs:  Heart was regular and there was no acute respiratory distress    Other observations:  Sitting up in bed    Method of pain control:  Intravenous Patient Controlled Analgesia:  Medication: Dilaudid  Infusion: 0  Demand Dose: 0.5mg    Lockout: 7 min    Diet:  NPO    NG tube:  No    Catheter Site:  N/A      Medications:    Current Facility-Administered Medications   Medication Dose Route Frequency   . docusate sodium  100 mg Oral Daily   . ketorolac  15 mg Intravenous Once   . levETIRAcetam  750 mg Intravenous Q12H SCH   . nystatin   Topical BID       acetaminophen **OR** acetaminophen, clonazePAM, cyclobenzaprine, diphenhydrAMINE **OR** diphenhydrAMINE, naloxone, naloxone, ondansetron **OR** ondansetron, ondansetron, promethazine, zolpidem    Prior to Admission medications    Medication Sig Start  Date End Date Taking? Authorizing Provider   BELSOMRA 20 MG Tab  01/02/16  Yes [provider]   butalbital-acetaminophen-caffeine-codeine (FIORICET WITH CODEINE) 50-325-40-30 MG per capsule  12/28/15  Yes [provider]   clonazePAM (KLONOPIN) 1 MG tablet Take 1 mg by mouth 2 (two) times daily as needed.   Yes [provider]   dronabinol (MARINOL) 5 MG capsule Take 5 mg by mouth 2 (two) times daily before meals.   Yes [provider]   hydrOXYzine (ATARAX) 25 MG tablet  10/20/15  Yes [provider]   levETIRAcetam (KEPPRA XR) 500 MG 24 hr tablet  01/05/16  Yes [provider]   omeprazole (PRILOSEC) 40 MG capsule Take 40 mg by mouth 2 (two) times daily.   Yes [provider]   OnabotulinumtoxinA (BOTOX IJ) Inject as directed. Every three months   Yes [provider]   ondansetron (ZOFRAN) 8 MG  tablet Take by mouth every 4 (four) hours as needed for Nausea.   Yes [provider]   promethazine (PHENERGAN) 25 MG tablet Take 25 mg by mouth every 6 (six) hours as needed.   Yes [provider]   topiramate (TOPAMAX) 200 MG tablet Take 400 mg by mouth 2 (two) times daily. 300mg  at night and 100mg  in the morning     Yes [provider]   traMODol (ULTRAM-ER) 200 MG 24 hr tablet  12/14/15  Yes [provider]   butorphanol (STADOL) 10 MG/ML nasal spray  01/05/16   [provider]   diphenhydrAMINE (BENADRYL) 50 MG tablet Take 50 mg by mouth nightly as needed.    [provider]   methocarbamol (ROBAXIN) 500 MG tablet  12/05/15   [provider]          Labs:     CBC    Recent Labs  Lab 01/08/16  0417   WBC 4.68   RBC 4.39*   HGB 13.1   HEMATOCRIT 38.0*   PLATELETS 198       Hepatic Function Panel    Recent Labs  Lab 01/08/16  0417 01/07/16  0156   AST (SGOT) 17 21   ALT 22 29   ALKALINE PHOSPHATASE 66 85        Renal Function Panel    Recent Labs  Lab 01/08/16  0417 01/07/16  0156   SODIUM 141  141   POTASSIUM 4.1 4.9   CHLORIDE 109 106   CO2 26 21*   BUN 10.0 12.0   CREATININE 1.1 0.9       Lab-UA with Micro    Recent Labs  Lab 01/07/16  0155   URINE TYPE Clean Catch   COLOR, UA Yellow   CLARITY, UA Clear   LEUKOCYTE ESTERASE, UA SMALL*   NITRITE, UA NEGATIVE   PROTEIN, UR NEGATIVE   GLUCOSE, UA NEGATIVE   KETONES UA NEGATIVE   BLOOD, UA NEGATIVE   WBC, UA 0 - 5       Coagulation Profile    Recent Labs  Lab 01/07/16  1333   PT 13.5   PT INR 1.0   PTT 34       Pregnancy Test  No results found for: URINEHCGQUAL  No components found for: SERUMHCGQUAL      Assessment:   Procedure(s):  EGD, BIOPSY with Dilation possible botox  Day of Surgery    Infusion day # 1    Type of pain:  Abdominal pain    Location: Abdomen    Pain control:  Pain manageable    Side effects:  None    Reviewed the PCA use: 2.0 mg Dilaudid/hour  over last 4 hours.     Plan:   I asked patient why he thinks he is hurting considering a relatively good GI report.  He said whenever his stomach is "messed with" he gets acute pain for a while.  I feel he won't need the PCA for long and should be able to transition back to his oral Dilaudid.  Possibly Calvin the PCA early this evening and transition to oral before anticipated discharge.    Please call Dr. Tamala Bari at [phone] or anesthesiologist on call at [phone] if pain is not optimally controlled.      Signed by: Karie Fetch, MD     01/08/2016  11:03 AM
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
## 2016-01-08 NOTE — Anesthesia Postprocedure Evaluation (Signed)
Anesthesia Post Evaluation    Patient: Erik Wang.    Procedures performed: Procedure(s):  EGD, BIOPSY with Dilation possible botox    Anesthesia type: General ETT    Patient location:Phase I PACU    Last vitals:   Filed Vitals:    01/08/16 1015   BP: 108/71   Pulse: 72   Temp:    Resp: 14   SpO2: 94%   97.8    Post pain: Patient not complaining of pain, continue current therapy      Mental Status:awake    Respiratory Function: tolerating room air    Cardiovascular: stable    Nausea/Vomiting: patient not complaining of nausea or vomiting    Hydration Status: adequate    Post assessment: no apparent anesthetic complications    Karie Fetch, 01/08/2016 10:18 AM
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
## 2016-01-08 NOTE — Plan of Care (Addendum)
Problem: Pain  Goal: Patient's pain/discomfort is manageable  Outcome: Progressing  Intervention: Include patient/family/caregiver in decisions related to pain management  Status: Pt c/o pain 10/10 in right upper quadrant, on PCA pump 0.5 dilaudid. Pt states minimal relief, notified MD on call and ordered toradol, phenergen, benadryl. Pt declined due to fear of past experience with NSAIDs and wanted only the phenergen and benadryl. Pt also c/o migraine, administered magnesium IV 1mg  per MD order.     Plan: Continue to monitor and assess pt for signs/symptoms of pain. Pt will have pain <6 by next shift.       Comments:   Pt NPO for EGD today. Pt had small bleeding from rectum during shift, notified MD and got order for stool sample. No BM yet. Pt also coughed up small amounts of blood, notified MD. No new order. On D5NS @ 100. PCA pump. Ambulatory. Service dog for multifocal seizures. Ambulatory.

## 2016-01-08 NOTE — PACU (Signed)
Called report to Joe at [phone], all questions answered, VSS, sending patient back to room

## 2016-01-09 LAB — COMPREHENSIVE METABOLIC PANEL
ALT: 23 U/L (ref 0–55)
AST (SGOT): 23 U/L (ref 5–34)
Albumin/Globulin Ratio: 1.6 (ref 0.9–2.2)
Albumin: 4 g/dL (ref 3.5–5.0)
Alkaline Phosphatase: 77 U/L (ref 38–106)
Anion Gap: 6 (ref 5.0–15.0)
BUN: 6 mg/dL — ABNORMAL LOW (ref 9.0–28.0)
Bilirubin, Total: 0.2 mg/dL (ref 0.2–1.2)
CO2: 28 mEq/L (ref 22–29)
Calcium: 9 mg/dL (ref 8.5–10.5)
Chloride: 107 mEq/L (ref 100–111)
Creatinine: 1 mg/dL (ref 0.7–1.3)
Globulin: 2.5 g/dL (ref 2.0–3.6)
Glucose: 90 mg/dL (ref 70–100)
Potassium: 4.5 mEq/L (ref 3.5–5.1)
Protein, Total: 6.5 g/dL (ref 6.0–8.3)
Sodium: 141 mEq/L (ref 136–145)

## 2016-01-09 LAB — CBC
Hematocrit: 40.2 % — ABNORMAL LOW (ref 42.0–52.0)
Hgb: 14.2 g/dL (ref 13.0–17.0)
MCH: 30.6 pg (ref 28.0–32.0)
MCHC: 35.3 g/dL (ref 32.0–36.0)
MCV: 86.6 fL (ref 80.0–100.0)
MPV: 10.3 fL (ref 9.4–12.3)
Nucleated RBC: 0 /100 WBC (ref 0–1)
Platelets: 256 10*3/uL (ref 140–400)
RBC: 4.64 10*6/uL — ABNORMAL LOW (ref 4.70–6.00)
RDW: 13 % (ref 12–15)
WBC: 5.63 10*3/uL (ref 3.50–10.80)

## 2016-01-09 LAB — HEMOLYSIS INDEX: Hemolysis Index: 16 (ref 0–18)

## 2016-01-09 LAB — GFR: EGFR: >60

## 2016-01-09 MED ORDER — ONDANSETRON HCL 4 MG/2ML IJ SOLN
4.0000 mg | Freq: Four times a day (QID) | INTRAMUSCULAR | Status: DC
Start: 2016-01-09 — End: 2016-01-11
  Administered 2016-01-09 – 2016-01-11 (×8): 4 mg via INTRAVENOUS
  Filled 2016-01-09 (×8): qty 2

## 2016-01-09 MED ORDER — DOCUSATE SODIUM 100 MG PO CAPS
100.0000 mg | ORAL_CAPSULE | Freq: Three times a day (TID) | ORAL | Status: DC
Start: 2016-01-09 — End: 2016-01-11
  Administered 2016-01-09 – 2016-01-11 (×5): 100 mg via ORAL
  Filled 2016-01-09 (×5): qty 1

## 2016-01-09 MED ORDER — DIPHENHYDRAMINE HCL 50 MG/ML IJ SOLN
12.5000 mg | Freq: Four times a day (QID) | INTRAMUSCULAR | Status: DC | PRN
Start: 2016-01-09 — End: 2016-01-09
  Administered 2016-01-09: 12.5 mg via INTRAVENOUS
  Filled 2016-01-09: qty 1

## 2016-01-09 MED ORDER — HYDROMORPHONE HCL 1 MG/ML IJ SOLN
2.0000 mg | INTRAMUSCULAR | Status: AC | PRN
Start: 2016-01-09 — End: 2016-01-10
  Administered 2016-01-09 – 2016-01-10 (×12): 2 mg via INTRAVENOUS
  Filled 2016-01-09 (×12): qty 2

## 2016-01-09 MED ORDER — DOCUSATE SODIUM 100 MG PO CAPS
100.0000 mg | ORAL_CAPSULE | Freq: Two times a day (BID) | ORAL | Status: DC | PRN
Start: 2016-01-09 — End: 2016-01-09
  Administered 2016-01-09: 100 mg via ORAL
  Filled 2016-01-09: qty 1

## 2016-01-09 MED ORDER — ENOXAPARIN SODIUM 40 MG/0.4ML SC SOLN
40.0000 mg | Freq: Every evening | SUBCUTANEOUS | Status: DC
Start: 2016-01-09 — End: 2016-01-11
  Administered 2016-01-09 – 2016-01-10 (×3): 40 mg via SUBCUTANEOUS
  Filled 2016-01-09 (×3): qty 0.4

## 2016-01-09 MED ORDER — MAGNESIUM SULFATE IN D5W 10-5 MG/ML-% IV SOLN
1.0000 g | Freq: Two times a day (BID) | INTRAVENOUS | Status: DC | PRN
Start: 2016-01-09 — End: 2016-01-11
  Administered 2016-01-11: 1 g via INTRAVENOUS
  Filled 2016-01-09: qty 100

## 2016-01-09 MED ORDER — HYDROMORPHONE HCL 1 MG/ML IJ SOLN
3.0000 mg | Freq: Once | INTRAMUSCULAR | Status: AC
Start: 2016-01-09 — End: 2016-01-09
  Administered 2016-01-09: 3 mg via INTRAVENOUS
  Filled 2016-01-09: qty 3

## 2016-01-09 MED ORDER — SCOPOLAMINE 1 MG/3DAYS TD PT72
1.0000 | MEDICATED_PATCH | TRANSDERMAL | Status: DC
Start: 2016-01-09 — End: 2016-01-11
  Administered 2016-01-09: 1 via TRANSDERMAL
  Filled 2016-01-09: qty 1

## 2016-01-09 MED ORDER — HYDROMORPHONE HCL 1 MG/ML IJ SOLN
4.0000 mg | Freq: Once | INTRAMUSCULAR | Status: AC
Start: 2016-01-09 — End: 2016-01-09
  Administered 2016-01-09: 4 mg via INTRAVENOUS
  Filled 2016-01-09: qty 4

## 2016-01-09 MED ORDER — HYDROXYZINE HCL 10 MG PO TABS
10.0000 mg | ORAL_TABLET | Freq: Four times a day (QID) | ORAL | Status: DC | PRN
Start: 2016-01-09 — End: 2016-01-11
  Administered 2016-01-09 (×2): 10 mg via ORAL
  Filled 2016-01-09 (×2): qty 1

## 2016-01-09 MED ORDER — ENOXAPARIN SODIUM 40 MG/0.4ML SC SOLN
40.0000 mg | Freq: Every day | SUBCUTANEOUS | Status: DC
Start: 2016-01-09 — End: 2016-01-09

## 2016-01-09 MED ORDER — DOCUSATE SODIUM 100 MG PO CAPS
100.0000 mg | ORAL_CAPSULE | Freq: Two times a day (BID) | ORAL | Status: DC
Start: 2016-01-09 — End: 2016-01-09

## 2016-01-09 MED ORDER — POLYETHYLENE GLYCOL 3350 17 G PO PACK
17.0000 g | PACK | Freq: Every day | ORAL | Status: DC
Start: 2016-01-09 — End: 2016-01-11
  Administered 2016-01-10: 17 g via ORAL
  Filled 2016-01-09 (×3): qty 1

## 2016-01-09 MED ORDER — PROMETHAZINE HCL 25 MG/ML IJ SOLN
25.0000 mg | Freq: Four times a day (QID) | INTRAMUSCULAR | Status: DC
Start: 2016-01-09 — End: 2016-01-11
  Administered 2016-01-09 – 2016-01-11 (×8): 25 mg via INTRAVENOUS
  Filled 2016-01-09 (×8): qty 1
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
## 2016-01-09 NOTE — Plan of Care (Signed)
Problem: Constipation  Goal: Elimination patterns are normal or improving  Outcome: Progressing  Intervention: Report abnormal assessment to physician  Status: Pt c/o constipation, pt on PCA dilaudid 0.5. Notified MD on call and ordered miralax. Pt declined due to past experience with miralax. Administered 1 dose of colace during shift. Pt had 2 small pebble-sized BMs during shift.     Plan: Continue to monitor and assess pt for constipation. Pt will have 2 BMs by end of next shift.      Problem: Nausea/Vomitting  Goal: Fluid and electrolyte balance are achieved/maintained  Outcome: Progressing  Intervention: Provide adequate hydration.  Status: Pt had 3 episodes of emesis during shift with shades of red from undigested cranberry juice. Pt c/o nausea, PRN phenergen and zofran. Pt on D5NS @100cc /hr    Plan: Continue to monitor and assess pt for nausea/vomiting. Encourage fluids.  Continue to monitor. Pt will have 0 episode of emesis until next shift.
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
## 2016-01-09 NOTE — Progress Notes (Signed)
PROGRESS NOTE:  Anesthesia daily PCA rounding    Date Time: 01/09/2016 1:04 PM  Patient Name: Erik Wang, Erik Wang.           Subjective:    Pain control: Patient reported that pain in manageable with PCA, but need titrate to PO and d/c home.    Patient reported side effects:  Nausea    Objective:    Sedation scale:  POSS  1-Awake and alert  Patient oriented x  Time , Place  and Person  Vitals: Patient Vitals for the past 12 hrs:   BP Temp Resp   01/09/16 1200 - - 16   01/09/16 0811 - - 16   01/09/16 0623 117/76 mmHg 36.1 C (97 F) 18   01/09/16 0400 - - 16   01/09/16 0138 - - 16       Height: 180.3 cm (5\' 11" )  Weight: 90.719 kg (200 lb)  BMI (calculated): 28    Heart and Lungs:  Heart was regular and there was no acute respiratory distress    Other observations:  Appears comfortable, Lying in bed comfortably    Method of pain control:  Intravenous Patient Controlled Analgesia:  Medication: Dilaudid  Infusion: 0  Demand Dose: 0.5mg    Lockout: 7 min    Diet:  Regular diet    NG tube:  No    Catheter Site:  Clean & dry      Medications:    Current Facility-Administered Medications   Medication Dose Route Frequency   . docusate sodium  100 mg Oral TID   . enoxaparin  40 mg Subcutaneous QHS   . HYDROmorphone  3 mg Intravenous Once   . ketorolac  15 mg Intravenous Once   . levETIRAcetam  750 mg Intravenous Q12H SCH   . nystatin   Topical BID   . ondansetron  4 mg Intravenous Q6H   . polyethylene glycol  17 g Oral Daily   . promethazine  25 mg Intravenous Q6H   . scopolamine  1 patch Transdermal Q72H       acetaminophen **OR** acetaminophen, clonazePAM, cyclobenzaprine, diphenhydrAMINE **OR** diphenhydrAMINE, HYDROmorphone, hydrOXYzine, magnesium sulfate, naloxone, zolpidem    Prior to Admission medications    Medication Sig Start Date End Date Taking? Authorizing Provider   BELSOMRA 20 MG Tab  01/02/16  Yes [provider]   butalbital-acetaminophen-caffeine-codeine (FIORICET WITH CODEINE) 50-325-40-30 MG per  capsule  12/28/15  Yes [provider]   clonazePAM (KLONOPIN) 1 MG tablet Take 1 mg by mouth 2 (two) times daily as needed.   Yes [provider]   dronabinol (MARINOL) 5 MG capsule Take 5 mg by mouth 2 (two) times daily before meals.   Yes [provider]   hydrOXYzine (ATARAX) 25 MG tablet  10/20/15  Yes [provider]   levETIRAcetam (KEPPRA XR) 500 MG 24 hr tablet  01/05/16  Yes [provider]   omeprazole (PRILOSEC) 40 MG capsule Take 40 mg by mouth 2 (two) times daily.   Yes [provider]   OnabotulinumtoxinA (BOTOX IJ) Inject as directed. Every three months   Yes [provider]   ondansetron (ZOFRAN) 8 MG tablet Take by mouth every 4 (four) hours as needed for Nausea.   Yes [provider]   promethazine (PHENERGAN) 25 MG tablet Take 25 mg by mouth every 6 (six) hours as needed.   Yes [provider]   topiramate (TOPAMAX) 200 MG tablet Take 400 mg by mouth 2 (  two) times daily. 300mg  at night and 100mg  in the morning     Yes [provider]   traMODol (ULTRAM-ER) 200 MG 24 hr tablet  12/14/15  Yes [provider]   butorphanol (STADOL) 10 MG/ML nasal spray  01/05/16   [provider]   diphenhydrAMINE (BENADRYL) 50 MG tablet Take 50 mg by mouth nightly as needed.    [provider]   methocarbamol (ROBAXIN) 500 MG tablet  12/05/15   [provider]          Labs:     CBC    Recent Labs  Lab 01/09/16  0517   WBC 5.63   RBC 4.64*   HGB 14.2   HEMATOCRIT 40.2*   PLATELETS 256       Hepatic Function Panel    Recent Labs  Lab 01/09/16  0517 01/08/16  0417 01/07/16  0156   AST (SGOT) 23 17 21    ALT 23 22 29    ALKALINE PHOSPHATASE 77 66 85        Renal Function Panel    Recent Labs  Lab 01/09/16  0517 01/08/16  0417 01/07/16  0156   SODIUM 141 141 141   POTASSIUM 4.5 4.1 4.9   CHLORIDE 107 109 106   CO2 28 26 21*   BUN 6.0* 10.0 12.0   CREATININE 1.0 1.1 0.9       Lab-UA with Micro    Recent  Labs  Lab 01/07/16  0155   URINE TYPE Clean Catch   COLOR, UA Yellow   CLARITY, UA Clear   LEUKOCYTE ESTERASE, UA SMALL*   NITRITE, UA NEGATIVE   PROTEIN, UR NEGATIVE   GLUCOSE, UA NEGATIVE   KETONES UA NEGATIVE   BLOOD, UA NEGATIVE   WBC, UA 0 - 5       Coagulation Profile    Recent Labs  Lab 01/07/16  1333   PT 13.5   PT INR 1.0   PTT 34       Pregnancy Test  No results found for: URINEHCGQUAL  No components found for: SERUMHCGQUAL      Assessment:   Procedure(s):  EGD, BIOPSY with Dilation possible botox  1 Day  PCA for the chronic pain in the abdomen.     Infusion day # 1    Type of pain:  Post trauma  pain.    Location: Abdomen    Pain control:  Pain is manageable on current regimen but needs to be titrated back to po pain meds.    Side effects:  Nausea    Reviewed the PCA use: the pump was taking away by RN.  Plan:     Discontinue the current PCA. Use IV PRN for pain and control the nausea. Hope patient can resume his home PO pain control regimen.  Add Scopolamine patch.  Please call Dr. Tamala Bari at [phone] or anesthesiologist on call at [phone] if pain is not optimally controlled.      Signed by: Wandra Mannan, MD     01/09/2016  1:04 PM

## 2016-01-10 LAB — HEMOLYSIS INDEX: Hemolysis Index: 20 — ABNORMAL HIGH (ref 0–18)

## 2016-01-10 LAB — CBC AND DIFFERENTIAL
Basophils Absolute Automated: 0.04 10*3/uL (ref 0.00–0.20)
Basophils Automated: 1 %
Eosinophils Absolute Automated: 0.21 10*3/uL (ref 0.00–0.70)
Eosinophils Automated: 5 %
Hematocrit: 39.8 % — ABNORMAL LOW (ref 42.0–52.0)
Hgb: 13.8 g/dL (ref 13.0–17.0)
Immature Granulocytes Absolute: 0.01 10*3/uL
Immature Granulocytes: 0 %
Lymphocytes Absolute Automated: 1.17 10*3/uL (ref 0.50–4.40)
Lymphocytes Automated: 29 %
MCH: 30 pg (ref 28.0–32.0)
MCHC: 34.7 g/dL (ref 32.0–36.0)
MCV: 86.5 fL (ref 80.0–100.0)
MPV: 9.8 fL (ref 9.4–12.3)
Monocytes Absolute Automated: 0.46 10*3/uL (ref 0.00–1.20)
Monocytes: 12 %
Neutrophils Absolute: 2.12 10*3/uL (ref 1.80–8.10)
Neutrophils: 53 %
Nucleated RBC: 0 /100 WBC (ref 0–1)
Platelets: 218 10*3/uL (ref 140–400)
RBC: 4.6 10*6/uL — ABNORMAL LOW (ref 4.70–6.00)
RDW: 12 % (ref 12–15)
WBC: 4 10*3/uL (ref 3.50–10.80)

## 2016-01-10 LAB — COMPREHENSIVE METABOLIC PANEL
ALT: 24 U/L (ref 0–55)
AST (SGOT): 23 U/L (ref 5–34)
Albumin/Globulin Ratio: 1.4 (ref 0.9–2.2)
Albumin: 3.6 g/dL (ref 3.5–5.0)
Alkaline Phosphatase: 72 U/L (ref 38–106)
Anion Gap: 7 (ref 5.0–15.0)
BUN: 5 mg/dL — ABNORMAL LOW (ref 9.0–28.0)
Bilirubin, Total: 0.2 mg/dL (ref 0.2–1.2)
CO2: 26 mEq/L (ref 22–29)
Calcium: 8.6 mg/dL (ref 8.5–10.5)
Chloride: 108 mEq/L (ref 100–111)
Creatinine: 0.9 mg/dL (ref 0.7–1.3)
Globulin: 2.6 g/dL (ref 2.0–3.6)
Glucose: 100 mg/dL (ref 70–100)
Potassium: 4.4 mEq/L (ref 3.5–5.1)
Protein, Total: 6.2 g/dL (ref 6.0–8.3)
Sodium: 141 mEq/L (ref 136–145)

## 2016-01-10 LAB — LAB USE ONLY - HISTORICAL SURGICAL PATHOLOGY

## 2016-01-10 LAB — MAGNESIUM: Magnesium: 2.1 mg/dL (ref 1.6–2.6)

## 2016-01-10 LAB — GFR: EGFR: >60

## 2016-01-10 MED ORDER — HYDROMORPHONE HCL 1 MG/ML IJ SOLN
1.0000 mg | INTRAMUSCULAR | Status: DC | PRN
Start: 2016-01-10 — End: 2016-01-11
  Administered 2016-01-10 – 2016-01-11 (×8): 1 mg via INTRAVENOUS
  Filled 2016-01-10 (×8): qty 1

## 2016-01-10 NOTE — Plan of Care (Signed)
Problem: Safety  Goal: Patient will be free from injury during hospitalization  Outcome: Progressing  Status:  AOX4, VSS. Ambulate with steady agit. Educated with side effects of the medication verbalize" I know what it is, Lavenia Atlas been with it for a long time". Verbalizes importance of safety.     Plan: Remain free from fall during hospital stay. Ensure safety environment free of clutter. Call bell within reach. Bed in lowest position. Seizure dog at bedside.     Problem: Pain  Goal: Patient's pain/discomfort is manageable  Outcome: Progressing  Status: C/O abdominal pain at least every two hours dilaudid was given .     Plan: Continue to assess pain and comfort level and medicate as needed. Monitor drowsiness from the medication.

## 2016-01-10 NOTE — Plan of Care (Signed)
Problem: Safety  Goal: Patient will be free from injury during hospitalization  Outcome: Progressing  Patient safety will be maintained by keeping bed wheels locked and bed in low postion.  Medications will not be left unattended in the room and room will be free from clutter.  Night-time lighting will be used.    Problem: Pain  Goal: Patient's pain/discomfort is manageable  Outcome: Progressing  Patient's pain is controlled with PRN medication administration as evidenced by a reduced pain rating as seen in pain reassessments.  Alternative pain relief methods explored and used as necessary.  Physician called when changes to regimen are required.    Problem: Psychosocial and Spiritual Needs  Goal: Demonstrates ability to cope with hospitalization/illness  Outcome: Progressing  Patient presents with a pleasant affect.  Friends/family at bedside for moral support.    Problem: Moderate/High Fall Risk Score >5  Goal: Patient will remain free of falls  Outcome: Progressing  Pt safety has been maintained, safety precautions reinforced. Bed locked and in the lowest position with 3/4 bed rails up. Bed alarm set. Nonskid socks on patient. Instructed pt to call for assistance before getting oob, pt verbalizes understanding. Call bell, personal belongings and side table within reach. Room free of clutter. Fall level marked on white board.     Comments:   Patient alert and oriented x 4.  VSS.  Complains of persistent severe pain, never below 7/10 despite 2mg  Dilaudid IV Q2 hrs.  Requests benadryl, dilaudid, phenergan, zofran around the clock.  Patient requests that we bang on door before entering.  2 reported episodes of vomiting but did not have a sample for me to see.  Service dog at bedside.
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
## 2016-01-10 NOTE — Progress Notes (Signed)
[facility]  PROGRESS NOTE      Patient: Erik Wang.  Date: 01/10/2016   LOS: 1 Days  Admission Date: 01/07/2016   MRN: [id number]  Attending: Jolyn Lent  Please contact me on the following Spectralink [id number]       ASSESSMENT/PLAN     [name]. is a 30 y.o. male admitted with Intractable nausea and vomiting    Interval Summary:     Patient Active Hospital Problem List:     Intractable nausea and vomiting (01/07/2016)  Intractable abdominal pain (01/07/2016)  H/O major abdominal surgery (01/07/2016)  Pyloric stenosis s/p traumatic injury/surgical repair  Gastric dysmotility  Chronic abd pain due to adhesions and chronic pancreatitis   Barrett Esophagus without dysplasia    Assessment: Nausea and vomiting likely due to pyloric stenosis    Plan:      Continue with IV dilaudid 2 mg with 1 more dose then decrease to 1 mg Q 2 hours   Discussed above plan with patient and bedside nurse   IV hydration   zofran and phenergan standing as per GI   GI input greatly appreciated   Clear liquid diet,     TBI (traumatic brain injury) (01/07/2016)  H/O complex partial seizures    Plan:  Neurology input greatly appreciated   continue with keppra 750 mg IVPB Q 12 hours   Will restart oral medication once able to tolerate oral meds   Seizure precaution   Discussed with patient the side effect of tramadol of lowering seizure threshold and he stated that he never heard of it and he will discuss with his outpatient PCP alternatives     H/O Pulmonary embolism (01/07/2016)   Plan:   patient on PPX dose as outpatient if he is admitted or long travel   Continue with DVT PPX dose       H/O Migraine Headache   Will order Magnesium Sulfate 1 g IVPB PRN for Migraine     Analgesia: As mentioned above     Nutrition: CLD     DVT Prophylaxis: lovenox        Code Status: Full code     DISPO: TBD          SUBJECTIVE     Erik Wang. states that his abdominal pain is 7-8/10, had 1 episode of vomiting      MEDICATIONS     Current Facility-Administered Medications   Medication Dose Route Frequency   . docusate sodium  100 mg Oral TID   . enoxaparin  40 mg Subcutaneous QHS   . ketorolac  15 mg Intravenous Once   . levETIRAcetam  750 mg Intravenous Q12H SCH   . nystatin   Topical BID   . ondansetron  4 mg Intravenous Q6H   . polyethylene glycol  17 g Oral Daily   . promethazine  25 mg Intravenous Q6H   . scopolamine  1 patch Transdermal Q72H       PHYSICAL EXAM     Filed Vitals:    01/10/16 0730   BP: 117/60   Pulse: 84   Temp: 96.5 F (35.8 C)   Resp: 17   SpO2: 100%       Temperature: Temp  Min: 96.5 F (35.8 C)  Max: 97.1 F (36.2 C)  Pulse: Pulse  Min: 77  Max: 84  Respiratory: Resp  Min: 17  Max: 17  Non-Invasive BP: BP  Min: 117/60  Max: 119/78  Pulse Oximetry SpO2  Min: 100 %  Max: 100 %    Intake and Output Summary (Last 24 hours) at Date Time    Intake/Output Summary (Last 24 hours) at 01/10/16 1619  Last data filed at 01/10/16 0654   Gross per 24 hour   Intake   2285 ml   Output      0 ml   Net   2285 ml         GEN APPEARANCE: Normal;  A&OX3  HEENT: PERLA; EOMI; Conjunctiva Clear  NECK: Supple; No bruits  CVS: RRR, S1, S2; No M/G/R  LUNGS: CTAB; No Wheezes; No Rhonchi: No rales  ABD: Soft; epigastric tenderness + Normoactive BS  EXT: No edema; Pulses 2+ and intact  Skin exam:  pink  NEURO: CN 2-12 intact; No Focal neurological deficits  CAP REFILL:  Normal  MENTAL STATUS:  Normal    Exam done by Jolyn Lent, MD on 01/10/2016 at 4:19 PM      LABS       Recent Labs  Lab 01/10/16  0529 01/09/16  0517 01/08/16  0417   WBC 4.00 5.63 4.68   RBC 4.60* 4.64* 4.39*   HGB 13.8 14.2 13.1   HEMATOCRIT 39.8* 40.2* 38.0*   MCV 86.5 86.6 86.6   PLATELETS 218 256 198         Recent Labs  Lab 01/10/16  0529 01/09/16  0517 01/08/16  0417 01/07/16  1333 01/07/16  0156   SODIUM 141 141 141  --  141   POTASSIUM 4.4 4.5 4.1  --  4.9   CHLORIDE 108 107 109  --  106   CO2 26 28 26   --  21*   BUN 5.0* 6.0* 10.0  --  12.0    CREATININE 0.9 1.0 1.1  --  0.9   GLUCOSE 100 90 116*  --  84   CALCIUM 8.6 9.0 8.3*  --  9.6   MAGNESIUM 2.1  --   --  1.9  --          Recent Labs  Lab 01/10/16  0529 01/09/16  0517 01/08/16  0417   ALT 24 23 22    AST (SGOT) 23 23 17    BILIRUBIN, TOTAL 0.2 0.2 0.2   ALBUMIN 3.6 4.0 3.6   ALKALINE PHOSPHATASE 72 77 66               Recent Labs  Lab 01/07/16  1333   PT INR 1.0   PT 13.5   PTT 34       Microbiology Results     None           RADIOLOGY     Reviewed     Liston Alba Phoenix Dresser  4:19 PM 01/10/2016

## 2016-01-11 LAB — BASIC METABOLIC PANEL
Anion Gap: 7 (ref 5.0–15.0)
BUN: 5 mg/dL — ABNORMAL LOW (ref 9.0–28.0)
CO2: 24 mEq/L (ref 22–29)
Calcium: 8.4 mg/dL — ABNORMAL LOW (ref 8.5–10.5)
Chloride: 109 mEq/L (ref 100–111)
Creatinine: 0.9 mg/dL (ref 0.7–1.3)
Glucose: 100 mg/dL (ref 70–100)
Potassium: 4.2 mEq/L (ref 3.5–5.1)
Sodium: 140 mEq/L (ref 136–145)

## 2016-01-11 LAB — CBC AND DIFFERENTIAL
Basophils Absolute Automated: 0.04 10*3/uL (ref 0.00–0.20)
Basophils Automated: 1 %
Eosinophils Absolute Automated: 0.22 10*3/uL (ref 0.00–0.70)
Eosinophils Automated: 5 %
Hematocrit: 40.1 % — ABNORMAL LOW (ref 42.0–52.0)
Hgb: 13.9 g/dL (ref 13.0–17.0)
Immature Granulocytes Absolute: 0 10*3/uL
Immature Granulocytes: 0 %
Lymphocytes Absolute Automated: 1.11 10*3/uL (ref 0.50–4.40)
Lymphocytes Automated: 25 %
MCH: 29.8 pg (ref 28.0–32.0)
MCHC: 34.7 g/dL (ref 32.0–36.0)
MCV: 85.9 fL (ref 80.0–100.0)
MPV: 10.1 fL (ref 9.4–12.3)
Monocytes Absolute Automated: 0.51 10*3/uL (ref 0.00–1.20)
Monocytes: 11 %
Neutrophils Absolute: 2.63 10*3/uL (ref 1.80–8.10)
Neutrophils: 58 %
Nucleated RBC: 0 /100 WBC (ref 0–1)
Platelets: 238 10*3/uL (ref 140–400)
RBC: 4.67 10*6/uL — ABNORMAL LOW (ref 4.70–6.00)
RDW: 13 % (ref 12–15)
WBC: 4.51 10*3/uL (ref 3.50–10.80)

## 2016-01-11 LAB — HEMOLYSIS INDEX: Hemolysis Index: 14 (ref 0–18)

## 2016-01-11 LAB — GFR: EGFR: >60

## 2016-01-11 LAB — MAGNESIUM: Magnesium: 2.1 mg/dL (ref 1.6–2.6)

## 2016-01-11 MED ORDER — SCOPOLAMINE 1 MG/3DAYS TD PT72
1.0000 | MEDICATED_PATCH | TRANSDERMAL | Status: AC
Start: 2016-01-11 — End: 2016-01-21

## 2016-01-11 MED ORDER — HEPARIN LOCK FLUSH 100 UNIT/ML IV SOLN
5.0000 mL | Freq: Once | INTRAVENOUS | Status: AC
Start: 2016-01-11 — End: 2016-01-11
  Administered 2016-01-11: 500 [IU]
  Filled 2016-01-11: qty 5

## 2016-01-11 MED ORDER — TRAMADOL HCL 50 MG PO TABS
50.0000 mg | ORAL_TABLET | Freq: Four times a day (QID) | ORAL | Status: DC
Start: 2016-01-11 — End: 2016-01-11
  Administered 2016-01-11: 50 mg via ORAL
  Filled 2016-01-11: qty 1

## 2016-01-11 MED ORDER — DSS 100 MG PO CAPS
100.0000 mg | ORAL_CAPSULE | Freq: Three times a day (TID) | ORAL | Status: AC
Start: 2016-01-11 — End: 2016-02-10

## 2016-01-11 MED ORDER — TRAMADOL HCL ER 200 MG PO TB24
200.0000 mg | ORAL_TABLET | Freq: Every day | ORAL | Status: AC | PRN
Start: 2016-01-11 — End: 2016-01-21

## 2016-01-11 NOTE — Progress Notes (Signed)
Patient wishes to go home due to a family emergency, RN paged Dr. Wynelle Bourgeois who will be by to evaluate and potentially discharge the patient per her evaluation and discussion. Patient notified and is agreeable to the situation. Will continue with hourly rounding and discharge once ordered.

## 2016-01-11 NOTE — Progress Notes (Signed)
Pt medically cleared for D/C.  Pt's sister on the way to pick up.  DCP: home with family, and guide dog.  No further CM needs     01/11/16 1310   Discharge Disposition   Patient preference/choice provided? Yes   Physical Discharge Disposition Home, No Needs   Mode of Transportation Car   Pick up time 14:00   Patient/Family/POA notified of transfer plan Yes   Patient agreeable to discharge plan/expected d/c date? Yes   Family/POA agreeable to discharge plan/expected d/c date? Yes   Bedside nurse notified of transport plan? Yes   CM Interventions   Follow up appointment scheduled? No   Reason no follow up scheduled? Family to schedule   Referral made for home health RN visit? Does not meet home bound criteria   Multidisciplinary rounds/family meeting before d/c? Yes   Medicare Checklist   Is this a Medicare patient? Yes   3 midnight inpatient qualifying stay (SNF only) No   If LOS 3 days or greater, did patient received 2nd IMM Letter? n/a     Sandi Raveling, BSN, RN  Clinical Case Manager I  [facility]  [address]  [place], [place] [address]  [phone]
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
## 2016-01-11 NOTE — Plan of Care (Signed)
Problem: Pain  Goal: Patient's pain/discomfort is manageable  Outcome: Progressing  Intervention: Include patient/family/caregiver in decisions related to pain management  Status: Pain medication decreased to 1mg  dilaudid Q2H PRN. Pt states pain at a 9/10, goes down to a 7/10 which is pt's goal.     Plan: Continue pain management.       Problem: Psychosocial and Spiritual Needs  Goal: Demonstrates ability to cope with hospitalization/illness  Outcome: Progressing  Intervention: Encourage verbalization of feelings/concerns/expectations  Status: Pt is very aware of his psychological needs and makes staff aware. Service dog at bedside. Pt's sister with her service dog visited at bedside.     Plan: Continue to encourage vocalization of feelings.       Problem: Nausea/Vomitting  Goal: Elimination patterns are normal or improving  Intervention: Administer treatments as ordered  Status: Pt reports nausea. No emesis. Scheduled phenergan and zofran given. Pt states slight relief.     Plan: Continue nausea management.

## 2016-01-11 NOTE — Discharge Instructions (Signed)
[facility] DISCHARGE INSTRUCTIONS     Date of Admission: 01/07/2016    Date of Discharge: 01/11/2016    Discharge Physician: Jolyn Lent    You were admitted to [facility] for  Intractable nausea and vomiting. It is important that you make your follow up appointments listed below and take your medications as prescribed.      If you are unable to obtain an appointment, unable to obtain newly prescribed medications, or are unclear about any of your discharge instructions please contact your discharging physician at [phone] (M-F, 8am-3pm) or weekends and after hours via the hospital operator [phone], hospital case manager, or your primary care physician.    Follow up Appointments  Follow-up Information     Follow up with Bland Span, MD. Schedule an appointment as soon as possible for a visit in 2 weeks.    Specialty:  Gastroenterology    Contact information:    [address]  [place] [address]  [phone]                Date Time: 01/11/2016 1:25 PM  Attending Physician: Jolyn Lent, MD    Date of Admission:   01/07/2016    Reason for Admission:   Intractable abdominal pain [R10.9]    Follow up:        Neurology (If Stroke): ______________________  Phone:______________________    Other:___________________________________   Phone: ______________________              Medications:  . Your medications have been listed for you on the Medication Reconciliation Discharge Home List. Please bring a copy of all discharge instructions, including your Medication Reconciliation Discharge Home List when you visit your physician.    . Continue taking all medications even if you feel well, unless otherwise instructed by physician.  . Do not take any over-the-counter medications or herbal supplements without checking with your pharmacist or doctor.     Activity:  . Rise slowly from a sitting or lying position. Increase activity slowly, unless otherwise instructed by  physician.  Marland Kitchen Perform exercises as desginated by Therapist and Physician.  . In the event of severe shortness of breath or chest discomfort, call 911. Do NOT drive to the hospital.  . Speak with your physician regarding specific driving and/or work restrictions.    Diet:  . If you have special diet orders, you have been given printed diet instructions.   ________________________________________________________________________    Tobacco Cessation Counseling:  If you are currently a tobacco user or have used tobacco within the last 12 months, we have provided you with written Tobacco Cessation Counseling.  ________________________________________________________________________    Heart Failure Education:  If you have a diagnosis of a Heart Failure, we have provided you with written Heart Failure Education.   . Weigh yourself once a day at the same time. Record and bring the weight record to your next physician appointment.   . Call your doctor if you gain more than 3 pounds in one day or 5 pounds in one week, or if you experience shortness of breath, leg swelling and/or chest discomfort.   . Enroll in the [employer] Tel-Assurance Program, a heart failure patient support program. Call [phone] for additional details.   ________________________________________________________________________    Diamond Nickel Education:  . Call 911 for:  Marland Kitchen Sudden numbness or weakness of the face  . Sudden numbness of the arm or leg especially one side of the body  . Sudden  confusion, trouble speaking or understanding  . Sudden trouble seeing in one or both eyes  . Sudden trouble walking or dizziness, loss of balance or coordination      . For promotion of your health, we have provided you with personalized written education on risk factors specific to your diagnosis, including but not limited to:  Marland Kitchen High Blood Pressure  . High Cholesterol  . Atrial Fibrillation  . Overweight  . Diabetes  . Smoking         Vaccinations  Pneumonia  Vaccine Received on:  Flu Vaccine Received on:             Treatments/Special Instructions:                  Signed by: Mackie Pai, RN    I HAVE RECEIVED AND UNDERSTAND THESE DISCHARGE INSTRUCTIONS.

## 2016-01-11 NOTE — Plan of Care (Signed)
Patient is medically cleared by physician and adequate for discharge in all aspects of the care plan. He will go home driven by his sister and with his service dog.

## 2016-01-22 ENCOUNTER — Encounter: Payer: Self-pay | Admitting: Gastroenterology

## 2016-03-05 ENCOUNTER — Inpatient Hospital Stay: Payer: Medicare Other | Admitting: Hospitalist

## 2016-03-05 ENCOUNTER — Inpatient Hospital Stay
Admission: AD | Admit: 2016-03-05 | Discharge: 2016-03-06 | DRG: 381 | Disposition: A | Discharge status: Home / Self Care | Payer: Medicare Other | Source: Other Acute Inpatient Hospital | Attending: Internal Medicine | Admitting: Internal Medicine

## 2016-03-05 ENCOUNTER — Encounter: Payer: Self-pay | Admitting: Hospitalist

## 2016-03-05 ENCOUNTER — Emergency Department
Admission: EM | Admit: 2016-03-05 | Discharge: 2016-03-05 | Disposition: A | Discharge status: Other Acute Inpatient Hospital | Payer: Medicare Other | Attending: Emergency Medicine | Admitting: Emergency Medicine

## 2016-03-05 ENCOUNTER — Emergency Department: Payer: Medicare Other

## 2016-03-05 DIAGNOSIS — R1084 Generalized abdominal pain: Secondary | ICD-10-CM

## 2016-03-05 DIAGNOSIS — R109 Unspecified abdominal pain: Secondary | ICD-10-CM | POA: Insufficient documentation

## 2016-03-05 DIAGNOSIS — R112 Nausea with vomiting, unspecified: Secondary | ICD-10-CM | POA: Insufficient documentation

## 2016-03-05 DIAGNOSIS — K219 Gastro-esophageal reflux disease without esophagitis: Secondary | ICD-10-CM | POA: Insufficient documentation

## 2016-03-05 DIAGNOSIS — Z79899 Other long term (current) drug therapy: Secondary | ICD-10-CM | POA: Insufficient documentation

## 2016-03-05 DIAGNOSIS — Z885 Allergy status to narcotic agent status: Secondary | ICD-10-CM | POA: Insufficient documentation

## 2016-03-05 DIAGNOSIS — R1110 Vomiting, unspecified: Secondary | ICD-10-CM

## 2016-03-05 DIAGNOSIS — Z880 Allergy status to penicillin: Secondary | ICD-10-CM | POA: Insufficient documentation

## 2016-03-05 DIAGNOSIS — G40209 Localization-related (focal) (partial) symptomatic epilepsy and epileptic syndromes with complex partial seizures, not intractable, without status epilepticus: Secondary | ICD-10-CM

## 2016-03-05 DIAGNOSIS — Z87891 Personal history of nicotine dependence: Secondary | ICD-10-CM | POA: Insufficient documentation

## 2016-03-05 DIAGNOSIS — Z86711 Personal history of pulmonary embolism: Secondary | ICD-10-CM | POA: Insufficient documentation

## 2016-03-05 DIAGNOSIS — R7401 Elevation of levels of liver transaminase levels: Secondary | ICD-10-CM | POA: Diagnosis present

## 2016-03-05 HISTORY — DX: Activated protein C resistance: D68.51

## 2016-03-05 HISTORY — DX: Sepsis, unspecified organism: A41.9

## 2016-03-05 LAB — CBC AND DIFFERENTIAL
Basophils Absolute Automated: 0.05 10*3/uL (ref 0.00–0.20)
Basophils Automated: 1 %
Eosinophils Absolute Automated: 0.23 10*3/uL (ref 0.00–0.70)
Eosinophils Automated: 3 %
Hematocrit: 43.2 % (ref 42.0–52.0)
Hgb: 15.2 g/dL (ref 13.0–17.0)
Lymphocytes Absolute Automated: 1.86 10*3/uL (ref 0.50–4.40)
Lymphocytes Automated: 28 %
MCH: 30.3 pg (ref 28.0–32.0)
MCHC: 35.2 g/dL (ref 32.0–36.0)
MCV: 86.2 fL (ref 80.0–100.0)
MPV: 10.4 fL (ref 9.4–12.3)
Monocytes Absolute Automated: 0.63 10*3/uL (ref 0.00–1.20)
Monocytes: 9 %
Neutrophils Absolute: 3.95 10*3/uL (ref 1.80–8.10)
Neutrophils: 59 %
Platelets: 196 10*3/uL (ref 140–400)
RBC: 5.01 10*6/uL (ref 4.70–6.00)
RDW: 13 % (ref 12–15)
WBC: 6.72 10*3/uL (ref 3.50–10.80)

## 2016-03-05 LAB — HEMOLYSIS INDEX: Hemolysis Index: 13 (ref 0–18)

## 2016-03-05 LAB — HEPATITIS B SURFACE ANTIGEN W/ REFLEX TO CONFIRMATION: Hepatitis B Surface Antigen: NONREACTIVE

## 2016-03-05 LAB — PT AND APTT
PT INR: 1 (ref 0.9–1.1)
PT: 12.8 s (ref 12.6–15.0)
PTT: 29 s (ref 23–37)

## 2016-03-05 LAB — URINALYSIS
Bilirubin, UA: NEGATIVE
Blood, UA: NEGATIVE
Glucose, UA: NEGATIVE
Ketones UA: NEGATIVE
Leukocyte Esterase, UA: NEGATIVE
Nitrite, UA: NEGATIVE
Protein, UR: NEGATIVE
Specific Gravity UA: 1.025 (ref 1.001–1.035)
Urine pH: 5 (ref 5.0–8.0)
Urobilinogen, UA: <2 mg/dL (ref 0.2–2.0)

## 2016-03-05 LAB — COMPREHENSIVE METABOLIC PANEL
ALT: 268 U/L — ABNORMAL HIGH (ref 0–55)
AST (SGOT): 62 U/L — ABNORMAL HIGH (ref 5–34)
Albumin/Globulin Ratio: 2 (ref 0.9–2.2)
Albumin: 4.3 g/dL (ref 3.5–5.0)
Alkaline Phosphatase: 121 U/L — ABNORMAL HIGH (ref 38–106)
Anion Gap: 12 (ref 5.0–15.0)
BUN: 11 mg/dL (ref 9.0–28.0)
Bilirubin, Total: 0.4 mg/dL (ref 0.2–1.2)
CO2: 21 mEq/L — ABNORMAL LOW (ref 22–29)
Calcium: 9.2 mg/dL (ref 8.5–10.5)
Chloride: 108 mEq/L (ref 100–111)
Creatinine: 0.9 mg/dL (ref 0.7–1.3)
Globulin: 2.2 g/dL (ref 2.0–3.6)
Glucose: 98 mg/dL (ref 70–100)
Potassium: 3.8 mEq/L (ref 3.5–5.1)
Protein, Total: 6.5 g/dL (ref 6.0–8.3)
Sodium: 141 mEq/L (ref 136–145)

## 2016-03-05 LAB — FERRITIN: Ferritin: 43.96 ng/mL (ref 21.80–274.70)

## 2016-03-05 LAB — IRON PROFILE
Iron Saturation: 59 % — ABNORMAL HIGH (ref 15–50)
Iron: 196 ug/dL — ABNORMAL HIGH (ref 40–160)
TIBC: 333 ug/dL (ref 261–462)
UIBC: 137 ug/dL (ref 126–382)

## 2016-03-05 LAB — LIPASE: Lipase: 26 U/L (ref 8–78)

## 2016-03-05 LAB — CERULOPLASMIN: Ceruloplasmin: 22 mg/dL (ref 20–60)

## 2016-03-05 LAB — GFR: EGFR: >60

## 2016-03-05 LAB — RAPID DRUG SCREEN, URINE
Barbiturate Screen, UR: POSITIVE — AB
Benzodiazepine Screen, UR: POSITIVE — AB
Cannabinoid Screen, UR: POSITIVE — AB
Cocaine, UR: NEGATIVE
Opiate Screen, UR: POSITIVE — AB
PCP Screen, UR: NEGATIVE
Urine Amphetamine Screen: NEGATIVE

## 2016-03-05 LAB — ALPHA-1-ANTITRYPSIN: Alpha 1-Antitrypsin: 130.4 mg/dL (ref 84.0–200.0)

## 2016-03-05 LAB — ETHANOL: Alcohol: NOT DETECTED mg/dL

## 2016-03-05 LAB — HEPATITIS B SURFACE ANTIBODY: HEPATITIS B SURFACE ANTIBODY: 328.29

## 2016-03-05 LAB — HEPATITIS C ANTIBODY: Hepatitis C, AB: NONREACTIVE

## 2016-03-05 MED ORDER — SENNOSIDES-DOCUSATE SODIUM 8.6-50 MG PO TABS
2.0000 | ORAL_TABLET | Freq: Two times a day (BID) | ORAL | Status: DC | PRN
Start: 2016-03-05 — End: 2016-03-06

## 2016-03-05 MED ORDER — ONDANSETRON HCL 4 MG/2ML IJ SOLN
4.0000 mg | INTRAMUSCULAR | Status: DC | PRN
Start: 2016-03-05 — End: 2016-03-06
  Administered 2016-03-05 – 2016-03-06 (×7): 4 mg via INTRAVENOUS
  Filled 2016-03-05 (×7): qty 2

## 2016-03-05 MED ORDER — TOPIRAMATE 100 MG PO TABS
300.0000 mg | ORAL_TABLET | Freq: Every evening | ORAL | Status: DC
Start: 2016-03-05 — End: 2016-03-06
  Administered 2016-03-05: 300 mg via ORAL
  Filled 2016-03-05: qty 3

## 2016-03-05 MED ORDER — DIPHENHYDRAMINE HCL 50 MG/ML IJ SOLN
25.0000 mg | Freq: Once | INTRAMUSCULAR | Status: AC
Start: 2016-03-05 — End: 2016-03-05
  Administered 2016-03-05: 25 mg via INTRAVENOUS
  Filled 2016-03-05: qty 1

## 2016-03-05 MED ORDER — SODIUM CHLORIDE 0.9 % IV BOLUS
1000.0000 mL | Freq: Once | INTRAVENOUS | Status: AC
Start: 2016-03-05 — End: 2016-03-05
  Administered 2016-03-05: 1000 mL via INTRAVENOUS

## 2016-03-05 MED ORDER — ONDANSETRON HCL 4 MG/2ML IJ SOLN
4.0000 mg | Freq: Once | INTRAMUSCULAR | Status: AC
Start: 2016-03-05 — End: 2016-03-05
  Administered 2016-03-05: 4 mg via INTRAVENOUS
  Filled 2016-03-05: qty 2

## 2016-03-05 MED ORDER — PANTOPRAZOLE SODIUM 40 MG IV SOLR
40.0000 mg | Freq: Once | INTRAVENOUS | Status: AC
Start: 2016-03-05 — End: 2016-03-05
  Administered 2016-03-05: 40 mg via INTRAVENOUS
  Filled 2016-03-05: qty 40

## 2016-03-05 MED ORDER — CYCLOBENZAPRINE HCL 10 MG PO TABS
10.0000 mg | ORAL_TABLET | Freq: Three times a day (TID) | ORAL | Status: DC
Start: 2016-03-05 — End: 2016-03-06
  Administered 2016-03-05 – 2016-03-06 (×3): 10 mg via ORAL
  Filled 2016-03-05 (×3): qty 1

## 2016-03-05 MED ORDER — TOPIRAMATE 100 MG PO TABS
100.0000 mg | ORAL_TABLET | Freq: Every day | ORAL | Status: DC
Start: 2016-03-05 — End: 2016-03-06
  Administered 2016-03-06: 100 mg via ORAL
  Filled 2016-03-05 (×2): qty 1

## 2016-03-05 MED ORDER — PROCHLORPERAZINE EDISYLATE 5 MG/ML IJ SOLN
10.0000 mg | Freq: Four times a day (QID) | INTRAMUSCULAR | Status: DC | PRN
Start: 2016-03-05 — End: 2016-03-05

## 2016-03-05 MED ORDER — LEVETIRACETAM 250 MG PO TABS
500.0000 mg | ORAL_TABLET | Freq: Two times a day (BID) | ORAL | Status: DC
Start: 2016-03-05 — End: 2016-03-06
  Administered 2016-03-05: 500 mg via ORAL
  Filled 2016-03-05 (×2): qty 2

## 2016-03-05 MED ORDER — NALOXONE HCL 0.4 MG/ML IJ SOLN (WRAP)
0.2000 mg | INTRAMUSCULAR | Status: DC | PRN
Start: 2016-03-05 — End: 2016-03-06

## 2016-03-05 MED ORDER — ACETAMINOPHEN 325 MG PO TABS
650.0000 mg | ORAL_TABLET | Freq: Four times a day (QID) | ORAL | Status: DC | PRN
Start: 2016-03-05 — End: 2016-03-05

## 2016-03-05 MED ORDER — HYDROMORPHONE HCL 1 MG/ML IJ SOLN
2.0000 mg | Freq: Once | INTRAMUSCULAR | Status: AC
Start: 2016-03-05 — End: 2016-03-05
  Administered 2016-03-05: 2 mg via INTRAVENOUS
  Filled 2016-03-05: qty 2

## 2016-03-05 MED ORDER — ONDANSETRON HCL 4 MG/2ML IJ SOLN
4.0000 mg | Freq: Four times a day (QID) | INTRAMUSCULAR | Status: DC | PRN
Start: 2016-03-05 — End: 2016-03-05

## 2016-03-05 MED ORDER — DIPHENHYDRAMINE HCL 50 MG/ML IJ SOLN
12.5000 mg | INTRAMUSCULAR | Status: DC | PRN
Start: 2016-03-05 — End: 2016-03-06
  Administered 2016-03-05 – 2016-03-06 (×6): 12.5 mg via INTRAVENOUS
  Filled 2016-03-05 (×6): qty 1

## 2016-03-05 MED ORDER — HYDROMORPHONE HCL 1 MG/ML IJ SOLN
1.0000 mg | Freq: Once | INTRAMUSCULAR | Status: AC
Start: 2016-03-05 — End: 2016-03-05
  Administered 2016-03-05: 1 mg via INTRAVENOUS
  Filled 2016-03-05: qty 1

## 2016-03-05 MED ORDER — SODIUM CHLORIDE 0.9 % IV SOLN
INTRAVENOUS | Status: AC
Start: 2016-03-05 — End: 2016-03-06

## 2016-03-05 MED ORDER — KETAMINE HCL-SODIUM CHLORIDE 20-0.9 MG/2ML-% IV SOSY
0.3000 mg/kg | PREFILLED_SYRINGE | Freq: Once | INTRAVENOUS | Status: AC
Start: 2016-03-05 — End: 2016-03-05
  Administered 2016-03-05: 21.2 mg via INTRAVENOUS
  Filled 2016-03-05: qty 4

## 2016-03-05 MED ORDER — DOCUSATE SODIUM 100 MG PO CAPS
100.0000 mg | ORAL_CAPSULE | Freq: Every day | ORAL | Status: DC
Start: 2016-03-05 — End: 2016-03-06
  Administered 2016-03-06: 100 mg via ORAL
  Filled 2016-03-05: qty 1

## 2016-03-05 MED ORDER — PROMETHAZINE HCL 25 MG PO TABS
25.0000 mg | ORAL_TABLET | Freq: Four times a day (QID) | ORAL | Status: DC | PRN
Start: 2016-03-05 — End: 2016-03-06

## 2016-03-05 MED ORDER — PANTOPRAZOLE SODIUM 40 MG PO TBEC
40.0000 mg | DELAYED_RELEASE_TABLET | Freq: Two times a day (BID) | ORAL | Status: DC
Start: 2016-03-05 — End: 2016-03-06
  Administered 2016-03-05 – 2016-03-06 (×2): 40 mg via ORAL
  Filled 2016-03-05 (×2): qty 1

## 2016-03-05 MED ORDER — TRAMADOL HCL 50 MG PO TABS
50.0000 mg | ORAL_TABLET | Freq: Four times a day (QID) | ORAL | Status: DC | PRN
Start: 2016-03-05 — End: 2016-03-06

## 2016-03-05 MED ORDER — HYDROMORPHONE HCL 1 MG/ML IJ SOLN
3.0000 mg | Freq: Once | INTRAMUSCULAR | Status: AC
Start: 2016-03-05 — End: 2016-03-05
  Administered 2016-03-05: 3 mg via INTRAVENOUS
  Filled 2016-03-05: qty 3

## 2016-03-05 MED ORDER — DRONABINOL 2.5 MG PO CAPS
5.0000 mg | ORAL_CAPSULE | Freq: Three times a day (TID) | ORAL | Status: DC
Start: 2016-03-05 — End: 2016-03-06
  Administered 2016-03-05 – 2016-03-06 (×3): 5 mg via ORAL
  Filled 2016-03-05 (×3): qty 2

## 2016-03-05 MED ORDER — SODIUM CHLORIDE 0.9 % IV BOLUS
1000.0000 mL | Freq: Once | INTRAVENOUS | Status: DC
Start: 2016-03-05 — End: 2016-03-05

## 2016-03-05 MED ORDER — PATIENT SUPPLIED NON FORMULARY
20.0000 mg | Freq: Every evening | Status: DC
Start: 2016-03-05 — End: 2016-03-06

## 2016-03-05 MED ORDER — KETAMINE HCL 10 MG/ML IJ SOLN
30.0000 mg | Freq: Once | INTRAMUSCULAR | Status: DC
Start: 2016-03-05 — End: 2016-03-05

## 2016-03-05 MED ORDER — FAMOTIDINE 10 MG/ML IV SOLN (WRAP)
20.0000 mg | Freq: Two times a day (BID) | INTRAVENOUS | Status: DC
Start: 2016-03-05 — End: 2016-03-06
  Administered 2016-03-05 – 2016-03-06 (×3): 20 mg via INTRAVENOUS
  Filled 2016-03-05 (×3): qty 2

## 2016-03-05 MED ORDER — ONDANSETRON 4 MG PO TBDP
4.0000 mg | ORAL_TABLET | Freq: Four times a day (QID) | ORAL | Status: DC | PRN
Start: 2016-03-05 — End: 2016-03-05

## 2016-03-05 MED ORDER — HYDROMORPHONE HCL 1 MG/ML IJ SOLN
2.0000 mg | INTRAMUSCULAR | Status: DC | PRN
Start: 2016-03-05 — End: 2016-03-05

## 2016-03-05 MED ORDER — LEVETIRACETAM 500 MG/5ML IV SOLN
500.0000 mg | Freq: Once | INTRAVENOUS | Status: AC
Start: 2016-03-05 — End: 2016-03-05
  Administered 2016-03-05: 500 mg via INTRAVENOUS
  Filled 2016-03-05: qty 5

## 2016-03-05 MED ORDER — HYDROMORPHONE HCL 1 MG/ML IJ SOLN
2.0000 mg | INTRAMUSCULAR | Status: DC | PRN
Start: 2016-03-05 — End: 2016-03-06
  Administered 2016-03-05 – 2016-03-06 (×12): 2 mg via INTRAVENOUS
  Filled 2016-03-05 (×11): qty 2

## 2016-03-05 MED ORDER — TRAMADOL HCL 50 MG PO TABS
100.0000 mg | ORAL_TABLET | Freq: Two times a day (BID) | ORAL | Status: DC
Start: 2016-03-05 — End: 2016-03-05

## 2016-03-05 MED ORDER — CALCIUM CARBONATE ANTACID 500 MG PO CHEW
1000.0000 mg | CHEWABLE_TABLET | Freq: Four times a day (QID) | ORAL | Status: DC | PRN
Start: 2016-03-05 — End: 2016-03-06
  Administered 2016-03-06: 1000 mg via ORAL
  Filled 2016-03-05: qty 2

## 2016-03-05 MED ORDER — DIPHENHYDRAMINE HCL 12.5 MG/5ML PO ELIX
25.0000 mg | ORAL_SOLUTION | Freq: Once | ORAL | Status: DC
Start: 2016-03-05 — End: 2016-03-05

## 2016-03-05 MED ORDER — LIDOCAINE VISCOUS 2 % MT SOLN
10.0000 mL | Freq: Once | OROMUCOSAL | Status: AC
Start: 2016-03-05 — End: 2016-03-05
  Administered 2016-03-05: 10 mL via OROMUCOSAL
  Filled 2016-03-05: qty 15

## 2016-03-05 MED ORDER — ONDANSETRON 4 MG PO TBDP
4.0000 mg | ORAL_TABLET | ORAL | Status: DC | PRN
Start: 2016-03-05 — End: 2016-03-06

## 2016-03-05 MED ORDER — ALUM & MAG HYDROXIDE-SIMETH 200-200-20 MG/5ML PO SUSP
30.0000 mL | Freq: Once | ORAL | Status: AC
Start: 2016-03-05 — End: 2016-03-05
  Administered 2016-03-05: 30 mL via ORAL
  Filled 2016-03-05: qty 30

## 2016-03-05 MED ORDER — BUTALBITAL-APAP-CAFFEINE 50-325-40 MG PO TABS
1.0000 | ORAL_TABLET | Freq: Four times a day (QID) | ORAL | Status: DC | PRN
Start: 2016-03-05 — End: 2016-03-06

## 2016-03-05 MED ORDER — CLONAZEPAM 1 MG PO TABS
1.0000 mg | ORAL_TABLET | Freq: Two times a day (BID) | ORAL | Status: DC | PRN
Start: 2016-03-05 — End: 2016-03-06

## 2016-03-05 MED ORDER — ENOXAPARIN SODIUM 40 MG/0.4ML SC SOLN
40.0000 mg | Freq: Every day | SUBCUTANEOUS | Status: DC
Start: 2016-03-05 — End: 2016-03-06
  Administered 2016-03-05: 40 mg via SUBCUTANEOUS
  Filled 2016-03-05 (×2): qty 0.4
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
## 2016-03-05 NOTE — ED Notes (Signed)
Transferred pt awake ambulating to the ambulance,service dog went with the patient nurse in [place] aware of the dog presence

## 2016-03-05 NOTE — ED Notes (Signed)
Pt came in ambulatory with his service dog c/o abdominal pain with vomiting x 3 weeks denies any diarrhea no dizziness
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
## 2016-03-05 NOTE — Plan of Care (Signed)
Problem: Pain  Goal: Patient's pain/discomfort is manageable  Outcome: Progressing  Patient reports pain between 7/10 and 10/10.  Pain being managed with dilaudid but patient reports never being less than a 7/10.  Patient refused tramadol due to nausea.  Will continue to promote rest and relaxation as well as pain medication when available.  Will keep patient involved and up to date with white communication board.

## 2016-03-05 NOTE — Plan of Care (Signed)
A 30 year old male with a history of pyloric stenosis with multiple interventions including EGD, dilatation and Botox injection, comes in with recurrent abdominal pain, nausea, and vomiting going on for the last 3 weeks.  He claims that he was dist racted and preoccupied with taking care of his newborn twins.  He is also on Lovenox for prophylaxis for PE attributed to factor V Leiden mutations.  The patient is currently on dilaudid for pain relief.  CT of the abdomen showed a possible small enteritis.  Plan to get GI involved.  We will consider pain management consult.  The patient's behavior including disconnecting himself from the IV line is concerning and we have addressed those with him.     We will also have a nursing instruction to allow the patient to walk his service dog; however, he needs to be accompanied with the hospital personnel, preferably a sitter on his trips outside given the active access to his port.     Teodora Medici MD, MPH  [facility]  [facility]   Pager: [phone]

## 2016-03-05 NOTE — ED Notes (Signed)
Pt is asking for more pain medication, reports his pain is still 8/10. States that his goal is his pain to go down to 7/10. Pt is slurring his speech but still alert, awake and oriented x 4 rr even and unlabored

## 2016-03-05 NOTE — ED Notes (Signed)
Iv port accessed aseptically,blood drawn and hooked on normal saline giving bolus.MEDICATION given thru port access.

## 2016-03-05 NOTE — ED Notes (Signed)
Pt walking around the ED hallway claimed want to stretch hi legs but claimed to be 9/10 pain no dry heaving this time

## 2016-03-05 NOTE — ED Notes (Signed)
Pt still having 8/10 pain texting in his cellphone
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
## 2016-03-05 NOTE — Progress Notes (Signed)
Pt from home due to abd pain. Pt self-care, fully indep, does use guide dog Woody (at bedside). Pt also seen regularly at   [facility] for medical treatment.   PCP Dr. Ortencia Kick, [facility] [phone]    DCP: home no needs  Sister to transport   Pt currently staying with his sister in the area and will go back to [facility]. Pt lives with spouse and family.      03/05/16 1359   Patient Type   Within 30 Days of Previous Admission? No   Medicare focused diagnosis patient? Not a Medicare focused diagnosis patient   Bundle patient? Not a bundle patient   Healthcare Decisions   Interviewed: Patient   Orientation/Decision Making Abilities of Patient Alert and Oriented x3, able to make decisions   Advance Directive Patient does not have advance directive   Prior to admission   Prior level of function Independent with ADLs;Ambulates independently   Type of Residence Private residence   Home Layout One level  (0 steps)   Have running water, electricity, heat, etc? Yes   Living Arrangements Spouse/significant other   How do you get to your MD appointments? self or sister   How do you get your groceries? self or spouse   Who fixes your meals? self or spouse   Who does your laundry? self or spouse   Who picks up your prescriptions? self or spouse or sister   Dressing Independent   Grooming Independent   Feeding Independent   Bathing Independent   Toileting Independent   DME Currently at Home Other (Comment)  (guide dog Woody)   Adult Management consultant (APS) involved? No   Discharge Planning   Support Systems Spouse/significant other   Patient expects to be discharged to: home no needs   Anticipated [place] plan discussed with: Patient   Follow up appointment scheduled? No   Reason no follow up scheduled? Family to schedule   Mode of transportation: Private car (family member)   Consults/Providers   Outcome Palliative Care Screen Screened but did not meet criteria for intervention   Correct PCP listed in Epic? Yes   Important Message from  [facility] Notice   Patient received 1st IMM Letter? Yes   Date of most recent IMM given: 03/05/16     Sandi Raveling, BSN, RN  Clinical Case Manager I  [facility]  [address]  [place], [place] [address]  [phone]

## 2016-03-06 ENCOUNTER — Encounter
Admission: AD | Disposition: A | Discharge status: Home / Self Care | Payer: Self-pay | Source: Other Acute Inpatient Hospital | Attending: Internal Medicine

## 2016-03-06 ENCOUNTER — Inpatient Hospital Stay: Payer: Medicare Other | Admitting: Anesthesiology

## 2016-03-06 DIAGNOSIS — K2970 Gastritis, unspecified, without bleeding: Secondary | ICD-10-CM

## 2016-03-06 HISTORY — PX: EGD, BOTOX INJECTION: SHX3797

## 2016-03-06 LAB — GFR: EGFR: >60

## 2016-03-06 LAB — CBC AND DIFFERENTIAL
Basophils Absolute Automated: 0.03 10*3/uL (ref 0.00–0.20)
Basophils Automated: 1 %
Eosinophils Absolute Automated: 0.15 10*3/uL (ref 0.00–0.70)
Eosinophils Automated: 4 %
Hematocrit: 40.7 % — ABNORMAL LOW (ref 42.0–52.0)
Hgb: 13.7 g/dL (ref 13.0–17.0)
Immature Granulocytes Absolute: 0.01 10*3/uL
Immature Granulocytes: 0 %
Lymphocytes Absolute Automated: 1.33 10*3/uL (ref 0.50–4.40)
Lymphocytes Automated: 36 %
MCH: 29.5 pg (ref 28.0–32.0)
MCHC: 33.7 g/dL (ref 32.0–36.0)
MCV: 87.7 fL (ref 80.0–100.0)
MPV: 10.8 fL (ref 9.4–12.3)
Monocytes Absolute Automated: 0.41 10*3/uL (ref 0.00–1.20)
Monocytes: 11 %
Neutrophils Absolute: 1.81 10*3/uL (ref 1.80–8.10)
Neutrophils: 48 %
Nucleated RBC: 0 /100 WBC (ref 0–1)
Platelets: 173 10*3/uL (ref 140–400)
RBC: 4.64 10*6/uL — ABNORMAL LOW (ref 4.70–6.00)
RDW: 13 % (ref 12–15)
WBC: 3.73 10*3/uL (ref 3.50–10.80)

## 2016-03-06 LAB — COMPREHENSIVE METABOLIC PANEL
ALT: 185 U/L — ABNORMAL HIGH (ref 0–55)
AST (SGOT): 53 U/L — ABNORMAL HIGH (ref 5–34)
Albumin/Globulin Ratio: 1.6 (ref 0.9–2.2)
Albumin: 3.6 g/dL (ref 3.5–5.0)
Alkaline Phosphatase: 113 U/L — ABNORMAL HIGH (ref 38–106)
Anion Gap: 4 — ABNORMAL LOW (ref 5.0–15.0)
BUN: 8 mg/dL — ABNORMAL LOW (ref 9.0–28.0)
Bilirubin, Total: 0.3 mg/dL (ref 0.2–1.2)
CO2: 25 mEq/L (ref 22–29)
Calcium: 8.6 mg/dL (ref 8.5–10.5)
Chloride: 108 mEq/L (ref 100–111)
Creatinine: 1.1 mg/dL (ref 0.7–1.3)
Globulin: 2.2 g/dL (ref 2.0–3.6)
Glucose: 112 mg/dL — ABNORMAL HIGH (ref 70–100)
Potassium: 3.9 mEq/L (ref 3.5–5.1)
Protein, Total: 5.8 g/dL — ABNORMAL LOW (ref 6.0–8.3)
Sodium: 137 mEq/L (ref 136–145)

## 2016-03-06 LAB — HEMOLYSIS INDEX: Hemolysis Index: 7 (ref 0–18)

## 2016-03-06 LAB — TYPE AND SCREEN
AB Screen Gel: NEGATIVE
ABO Rh: A POS

## 2016-03-06 LAB — MAGNESIUM: Magnesium: 1.8 mg/dL (ref 1.6–2.6)

## 2016-03-06 LAB — ANA SCREEN REFLEX: ANA Screen Reflex: NEGATIVE

## 2016-03-06 SURGERY — ESOPHAGOGASTRODUODENOSCOPY (EGD), INJECTION  BOTULINUM TOXIN
Anesthesia: Anesthesia General | Site: Esophagus | Laterality: Left | Wound class: Clean Contaminated

## 2016-03-06 MED ORDER — HYDROMORPHONE HCL 1 MG/ML IJ SOLN
INTRAMUSCULAR | Status: AC
Start: 2016-03-06 — End: ?
  Filled 2016-03-06: qty 2

## 2016-03-06 MED ORDER — SODIUM CHLORIDE 0.9 % IV SOLN
INTRAVENOUS | Status: DC
Start: 2016-03-06 — End: 2016-03-06

## 2016-03-06 MED ORDER — LIDOCAINE HCL 2 % IJ SOLN
INTRAMUSCULAR | Status: DC | PRN
Start: 2016-03-06 — End: 2016-03-06
  Administered 2016-03-06: 80 mg

## 2016-03-06 MED ORDER — PROPOFOL 10 MG/ML IV EMUL (WRAP)
INTRAVENOUS | Status: AC
Start: 2016-03-06 — End: ?
  Filled 2016-03-06: qty 20

## 2016-03-06 MED ORDER — TRAMADOL HCL 50 MG PO TABS
50.0000 mg | ORAL_TABLET | Freq: Four times a day (QID) | ORAL | Status: DC | PRN
Start: 2016-03-06 — End: 2018-02-05

## 2016-03-06 MED ORDER — DIPHENHYDRAMINE HCL 50 MG/ML IJ SOLN
INTRAMUSCULAR | Status: AC
Start: 2016-03-06 — End: ?
  Filled 2016-03-06: qty 1

## 2016-03-06 MED ORDER — LACTATED RINGERS IV SOLN
INTRAVENOUS | Status: DC
Start: 2016-03-06 — End: 2016-03-06

## 2016-03-06 MED ORDER — ONDANSETRON HCL 4 MG/2ML IJ SOLN
INTRAMUSCULAR | Status: DC | PRN
Start: 2016-03-06 — End: 2016-03-06
  Administered 2016-03-06: 4 mg via INTRAVENOUS

## 2016-03-06 MED ORDER — SODIUM CHLORIDE 0.9 % IV SOLN
500.0000 mg | Freq: Two times a day (BID) | INTRAVENOUS | Status: DC
Start: 2016-03-06 — End: 2016-03-06
  Administered 2016-03-06: 500 mg via INTRAVENOUS
  Filled 2016-03-06 (×2): qty 5

## 2016-03-06 MED ORDER — SODIUM CHLORIDE 0.9 % IJ SOLN
INTRAMUSCULAR | Status: AC
Start: 2016-03-06 — End: ?
  Filled 2016-03-06: qty 10

## 2016-03-06 MED ORDER — PROPOFOL INFUSION 10 MG/ML
INTRAVENOUS | Status: DC | PRN
Start: 2016-03-06 — End: 2016-03-06
  Administered 2016-03-06: 140 ug/kg/min via INTRAVENOUS

## 2016-03-06 MED ORDER — ONDANSETRON HCL 4 MG/2ML IJ SOLN
INTRAMUSCULAR | Status: AC
Start: 2016-03-06 — End: ?
  Filled 2016-03-06: qty 2

## 2016-03-06 MED ORDER — MIDAZOLAM HCL 2 MG/2ML IJ SOLN
INTRAMUSCULAR | Status: AC
Start: 2016-03-06 — End: ?
  Filled 2016-03-06: qty 2

## 2016-03-06 MED ORDER — LEVETIRACETAM 250 MG PO TABS
500.0000 mg | ORAL_TABLET | Freq: Two times a day (BID) | ORAL | Status: DC
Start: 2016-03-06 — End: 2016-03-06

## 2016-03-06 MED ORDER — PROPOFOL 10 MG/ML IV EMUL (WRAP)
INTRAVENOUS | Status: AC
Start: 2016-03-06 — End: ?
  Filled 2016-03-06: qty 40

## 2016-03-06 MED ORDER — HYDROMORPHONE HCL 1 MG/ML IJ SOLN
1.0000 mg | INTRAMUSCULAR | Status: DC | PRN
Start: 2016-03-06 — End: 2016-03-06

## 2016-03-06 MED ORDER — PROPOFOL 10 MG/ML IV EMUL (WRAP)
INTRAVENOUS | Status: DC | PRN
Start: 2016-03-06 — End: 2016-03-06
  Administered 2016-03-06: 120 mg via INTRAVENOUS

## 2016-03-06 MED ORDER — DICYCLOMINE HCL 10 MG PO CAPS
10.0000 mg | ORAL_CAPSULE | Freq: Four times a day (QID) | ORAL | Status: DC | PRN
Start: 2016-03-06 — End: 2016-03-06

## 2016-03-06 MED ORDER — ONABOTULINUMTOXINA 100 UNITS IJ SOLR
100.0000 [IU] | Freq: Once | INTRAMUSCULAR | Status: AC
Start: 2016-03-06 — End: 2016-03-06
  Administered 2016-03-06: 2.5 [IU]
  Filled 2016-03-06: qty 100

## 2016-03-06 MED ORDER — HYDROMORPHONE HCL 1 MG/ML IJ SOLN
INTRAMUSCULAR | Status: DC | PRN
Start: 2016-03-06 — End: 2016-03-06
  Administered 2016-03-06: 2 mg via INTRAVENOUS

## 2016-03-06 MED ORDER — DIPHENHYDRAMINE HCL 50 MG/ML IJ SOLN
INTRAMUSCULAR | Status: DC | PRN
Start: 2016-03-06 — End: 2016-03-06
  Administered 2016-03-06: 12.5 mg via INTRAVENOUS

## 2016-03-06 MED ORDER — LIDOCAINE HCL (PF) 2 % IJ SOLN
INTRAMUSCULAR | Status: AC
Start: 2016-03-06 — End: ?
  Filled 2016-03-06: qty 5

## 2016-03-06 MED ORDER — PATIENT SUPPLIED NON FORMULARY
200.0000 mg | Status: DC
Start: 2016-03-06 — End: 2016-03-06

## 2016-03-06 MED ORDER — MIDAZOLAM HCL 2 MG/2ML IJ SOLN
INTRAMUSCULAR | Status: DC | PRN
Start: 2016-03-06 — End: 2016-03-06
  Administered 2016-03-06: 2 mg via INTRAVENOUS

## 2016-03-06 MED ORDER — HEPARIN LOCK FLUSH 100 UNIT/ML IV SOLN
5.0000 mL | Freq: Once | INTRAVENOUS | Status: DC
Start: 2016-03-06 — End: 2016-03-06
  Filled 2016-03-06: qty 5

## 2016-03-06 SURGICAL SUPPLY — 23 items
BLOCK BITE MAXI 60FR LF STRD STRAP SDPRT (Procedure Accessories) ×1
BLOCK BITE OD60 FR STURDY STRAP SIDEPORT (Procedure Accessories) ×1 IMPLANT
BLOCK BITE OD60 FR STURDY STRAP SIDEPORT DENTAL RETENTION RIM MAXI (Procedure Accessories) ×1 IMPLANT
FORCEPS BIOPSY L240 CM MICROMESH TEETH STREAMLINE CATHETER NEEDLE (Instrument) IMPLANT
FORCEPS BIOPSY L240 CM STANDARD CAPACITY (Instrument) ×1
FORCEPS BX STD CPC RJ 4 2.2MM 240CM STRL (Instrument) ×1
GOWN ISL PP PE REG LG LF FULL BCK NK TIE (Gown) ×4 IMPLANT
GOWN ISOLATION REGULAR LARGE FULL BACK NECK TIE ELASTIC CUFF (Gown) ×2 IMPLANT
KIT UNIVERSAL IRRIGATION SOL (Kits) ×2 IMPLANT
MASK FLUID SHIELD W WRAP (Personal Protection) ×4 IMPLANT
NEEDLE CARR-LOCKE INJECT 25GX5 (Needles) ×2 IMPLANT
SOLN LUBRICATING JELLY 4.25OZ (Irrigation Solutions) ×2 IMPLANT
SPONGE GAUZE L4 IN X W4 IN 16 PLY (Dressing) ×1 IMPLANT
SPONGE GAUZE L4 IN X W4 IN 16 PLY MAXIMUM ABSORBENT USP TYPE VII (Dressing) ×1 IMPLANT
SPONGE GZE CTTN CRTY 4X4IN LF NS 16 PLY (Dressing) ×1
SYRINGE 50 ML GRADUATE NONPYROGENIC DEHP (Syringes, Needles) ×1 IMPLANT
SYRINGE 50 ML GRADUATE NONPYROGENIC DEHP FREE PVC FREE BD MEDICAL (Syringes, Needles) IMPLANT
SYRINGE MED 50ML LF STRL GRAD N-PYRG (Syringes, Needles) ×1
WATER STERILE PLASTIC POUR BOTTLE 1000 (Irrigation Solutions) ×1 IMPLANT
WATER STERILE PLASTIC POUR BOTTLE 1000 ML (Irrigation Solutions) ×1 IMPLANT
WATER STERILE PLASTIC POUR BOTTLE 250 ML (Irrigation Solutions) ×1 IMPLANT
WATER STRL 1000ML LF PLS PR BTL (Irrigation Solutions) ×1
WATER STRL 250ML LF PLS PR BTL (Irrigation Solutions) ×1

## 2016-03-06 NOTE — Plan of Care (Signed)
Problem: Safety  Goal: Patient will be free from injury during hospitalization  Outcome: Progressing  Status: AOX4, VSS. Able to ambulate to the bathroom independently with steady gait, at times unsteady due to pain medication. Verbalizes importance of safety.     Plan: remain free from fall during hospital stay. Ensure safety environment free of clutter. Call bell within easy reach. Bed in lowest position. Non skid socks and fall mats on. Seizure pads applied.     Problem: Pain  Goal: Patient's pain/discomfort is manageable  Outcome: Progressing  Status: C/O epigastric pain at a rate from 8-9/10 medicated every two hours with dilaudid with relief noted and came back up again after two hours. Educated with side effects of the medication and verbalizes understanding. C/o itching and wanted to have benadryl together with dilaudid but explain about protocol and agree to have it given 20 minutes after pain medication.     Plan: Continue to assess pain level and medicate as needed.     Comments:   C/O nausea medicated with zofran prior to given oral meds but after 15 minutes of taking oral meds he vomited foul smelling vomitus brownish in color. Zofran given 2X. At 0357 asking for Pepcid which RN already gave, TUMS given for stomach upset and relief verbalizes after. At 1930 he walk outside to have his dog urinate accompanied by RN Joe at 0330 he walk outside again this time accompanied by Charge Nurse helen.   Will continue to monitor. Safety.
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
## 2016-03-06 NOTE — Transfer of Care (Signed)
Anesthesia Transfer of Care Note    Patient: Erik Wang.    Procedures performed: Procedure(s):  EGD, BOTOX INJECTION    Anesthesia type: General TIVA    Patient location:[facility]    Last vitals:   Filed Vitals:    03/06/16 1500   BP: 118/65   Pulse: 80   Temp: 36.1 C (97 F)   Resp: 15   SpO2: 98%       Post pain: Patient not complaining of pain, continue current therapy      Mental Status:sedated    Respiratory Function: tolerating nasal cannula    Cardiovascular: stable    Nausea/Vomiting: patient not complaining of nausea or vomiting    Hydration Status: adequate    Post assessment: no apparent anesthetic complications, no reportable events and no evidence of recall    Signed by: Clemens Catholic  03/06/2016 3:00 PM

## 2016-03-06 NOTE — H&P (Signed)
GI PRE PROCEDURE NOTE    Date Time: 03/06/2016 2:32 PM  Patient Name: CREWE, HEATHMAN.  Attending Physician: Teodora Medici, MD    Proceduralist Comments:   Review of Systems and Past Medical / Surgical History performed: Yes     Indications:Abdominal pain, Nausea, Vomiting and pyloric stenosis    Previous Adverse Reaction to Anesthesia or Sedation (if yes, describe): No    Physical Exam / Laboratory Data (If applicable)   Airway Classification: Per Anesthesiologist    General: Alert and cooperative  Lungs: Lungs clear to auscultation  Cardiac: RRR, normal S1S2.    Abdomen: Soft, non tender. Normal active bowel sounds  Other:     No labs drawn    American Society of Anesthesiologists (ASA) Physical Status Classification:   Per Anesthesiologist    Planned Sedation:   Deep sedation with anesthesia    Attestation:   [name]. has been reassessed immediately prior to the procedure and is an appropriate candidate for the planned sedation and procedure. Risks, benefits and alternatives to the planned procedure and sedation have been explained to the patient or guardian:  yes        Signed by: Pershing Proud MD       03/06/2016  2:32 PM
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
## 2016-03-06 NOTE — Plan of Care (Signed)
Problem: Pain  Goal: Patient's pain/discomfort is manageable  Outcome: Progressing  Pt c/o abdominal pain/N/itching, Dilaudid given, Zofran, and benadryl given as ordered.   Plan: continue pain/N management.     Comments:   Pt s/p EGD and gastric biopsy. Discharge pt home today.
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
## 2016-03-06 NOTE — Progress Notes (Signed)
Discharge instruction, prescription, and follow up apt reviewed with pt, pt verbalized understanding. R chest Mediport flushed with NS then Heparin per protocol. All personal belongings, pt's home medication, prescription and discharge instruction given to the pt, pt refused wheelchair, pt left the unit in stable condition, accompanied by his service dog, per pt " sister is waiting for him at hospital entrance".
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
## 2016-03-06 NOTE — Anesthesia Postprocedure Evaluation (Signed)
Anesthesia Post Evaluation    Patient: Erik Wang.    Procedures performed: Procedure(s):  EGD, BOTOX INJECTION    Anesthesia type: General TIVA    Patient location:[facility] Recovery    Last vitals:   Filed Vitals:    03/06/16 1700   BP: 124/75   Pulse: 65   Temp: 36.9 C (98.5 F)   Resp: 16   SpO2: 98%       Post pain: Continue adjustment of pain medication; dilaudid ordered     Mental Status:awake and alert     Respiratory Function: tolerating room air    Cardiovascular: stable    Nausea/Vomiting: patient not complaining of nausea or vomiting    Hydration Status: adequate    Post assessment: no apparent anesthetic complications, no reportable events and no evidence of recall    Alden Server, 03/06/2016 6:55 PM

## 2016-03-07 LAB — PROTEIN ELECTROPHORESIS, SERUM
Albumin %: 55.5 % (ref 46.6–62.6)
Albumin, Synovial: 3.1 g/dL — ABNORMAL LOW (ref 3.4–4.8)
Alpha-1 Glob %: 3.7 % (ref 1.7–4.1)
Alpha-1 Globulin: 0.2 g/dL (ref 0.1–0.4)
Alpha-2 Glob %: 13.8 % (ref 8.9–14.9)
Alpha-2 Globulin: 0.8 g/dL (ref 0.8–1.2)
Beta Glob %: 15.8 % (ref 10.9–18.9)
Beta Globulin: 0.9 g/dL (ref 0.6–1.2)
Gamma Globulin %: 11.2 % (ref 9.8–24.4)
Gamma Globulin: 0.6 g/dL (ref 0.6–1.7)
Protein, Total: 5.6 g/dL — ABNORMAL LOW (ref 6.0–8.3)

## 2016-03-07 LAB — TISSUE TRANSGLUTAMINASE ANTIBODY PANEL
Tissue Transglutaminase AB, IgA: 1 (ref <4)
Tissue Transglutaminase AB, IgG: 1 (ref <6)

## 2016-03-07 LAB — MITOCHONDRIAL AND ACTIN ANTIBODY PANEL
Actin Antibody IgG: <20 U (ref <20)
Mitochondria M2 Ab (IgG): <=20 U (ref <=20.0)

## 2016-03-07 LAB — LAB USE ONLY - HISTORICAL SURGICAL PATHOLOGY

## 2016-03-08 LAB — HEPATITIS B CORE ANTIBODY, TOTAL: Hepatitis B Core Total AB: NONREACTIVE

## 2016-03-10 LAB — SERUM PROTEIN ELECTROPHORESIS REVIEW

## 2016-03-12 ENCOUNTER — Encounter: Payer: Self-pay | Admitting: Gastroenterology

## 2016-04-20 ENCOUNTER — Emergency Department: Payer: MEDICARE | Primary: Family Medicine

## 2016-04-20 ENCOUNTER — Inpatient Hospital Stay
Admit: 2016-04-20 | Discharge: 2016-04-21 | Disposition: A | Discharge status: Home / Self Care | Payer: MEDICARE | Attending: Emergency Medicine

## 2016-04-20 ENCOUNTER — Emergency Department: Admit: 2016-04-20 | Payer: MEDICARE | Primary: Family Medicine

## 2016-04-20 DIAGNOSIS — N200 Calculus of kidney: Secondary | ICD-10-CM

## 2016-04-20 LAB — CBC WITH AUTOMATED DIFF
ABS. BASOPHILS: 0 10*3/uL (ref 0.0–0.2)
ABS. EOSINOPHILS: 0.1 10*3/uL (ref 0.0–0.8)
ABS. IMM. GRANS.: 0 10*3/uL (ref 0.0–0.5)
ABS. LYMPHOCYTES: 1.4 10*3/uL (ref 0.5–4.6)
ABS. MONOCYTES: 0.4 10*3/uL (ref 0.1–1.3)
ABS. NEUTROPHILS: 3.1 10*3/uL (ref 1.7–8.2)
BASOPHILS: 1 % (ref 0.0–2.0)
EOSINOPHILS: 1 % (ref 0.5–7.8)
HCT: 43.7 % (ref 41.1–50.3)
HGB: 15.6 g/dL (ref 13.6–17.2)
IMMATURE GRANULOCYTES: 0.2 % (ref 0.0–5.0)
LYMPHOCYTES: 28 % (ref 13–44)
MCH: 30.4 PG (ref 26.1–32.9)
MCHC: 35.7 g/dL — ABNORMAL HIGH (ref 31.4–35.0)
MCV: 85.2 FL (ref 79.6–97.8)
MONOCYTES: 8 % (ref 4.0–12.0)
MPV: 10.7 FL — ABNORMAL LOW (ref 10.8–14.1)
NEUTROPHILS: 62 % (ref 43–78)
PLATELET: 221 10*3/uL (ref 150–450)
RBC: 5.13 M/uL (ref 4.23–5.67)
RDW: 13.1 % (ref 11.9–14.6)
WBC: 5 10*3/uL (ref 4.3–11.1)

## 2016-04-20 LAB — METABOLIC PANEL, COMPREHENSIVE
A-G Ratio: 1.2 (ref 1.2–3.5)
ALT (SGPT): 95 U/L — ABNORMAL HIGH (ref 12–65)
AST (SGOT): 49 U/L — ABNORMAL HIGH (ref 15–37)
Albumin: 3.7 g/dL (ref 3.5–5.0)
Alk. phosphatase: 77 U/L (ref 50–136)
Anion gap: 9 mmol/L (ref 7–16)
BUN: 17 MG/DL (ref 6–23)
Bilirubin, total: 0.2 MG/DL (ref 0.2–1.1)
CO2: 26 mmol/L (ref 21–32)
Calcium: 8.6 MG/DL (ref 8.3–10.4)
Chloride: 110 mmol/L — ABNORMAL HIGH (ref 98–107)
Creatinine: 0.91 MG/DL (ref 0.8–1.5)
GFR est AA: >60 mL/min/{1.73_m2} (ref >60)
GFR est non-AA: >60 mL/min/{1.73_m2} (ref >60)
Globulin: 3.2 g/dL (ref 2.3–3.5)
Glucose: 87 mg/dL (ref 65–100)
Potassium: 4 mmol/L (ref 3.5–5.1)
Protein, total: 6.9 g/dL (ref 6.3–8.2)
Sodium: 145 mmol/L (ref 136–145)

## 2016-04-20 LAB — URINE MICROSCOPIC
Bacteria: 0 /hpf
Casts: 0 /lpf
Epithelial cells: 0 /hpf
RBC: >100 /hpf — ABNORMAL HIGH

## 2016-04-20 LAB — C REACTIVE PROTEIN, QT: C-Reactive protein: 0.3 mg/dL (ref 0.0–0.9)

## 2016-04-20 LAB — LIPASE: Lipase: 113 U/L (ref 73–393)

## 2016-04-20 MED ORDER — LEVETIRACETAM 250 MG TAB
250 mg | ORAL | Status: DC
Start: 2016-04-20 — End: 2016-04-20

## 2016-04-20 MED ORDER — LEVETIRACETAM 500 MG/5 ML IV SOLN
5005 mg/5 mL | Freq: Once | INTRAVENOUS | Status: AC
Start: 2016-04-20 — End: 2016-04-21
  Administered 2016-04-21: via INTRAVENOUS

## 2016-04-20 MED ORDER — HYDROMORPHONE (PF) 1 MG/ML IJ SOLN
1 mg/mL | INTRAMUSCULAR | Status: DC | PRN
Start: 2016-04-20 — End: 2016-04-21
  Administered 2016-04-20: via INTRAVENOUS

## 2016-04-20 MED ORDER — SODIUM CHLORIDE 0.9 % INJECTION
25 mg/mL | INTRAMUSCULAR | Status: AC
Start: 2016-04-20 — End: 2016-04-20
  Administered 2016-04-20: via INTRAVENOUS

## 2016-04-20 MED ORDER — LIDOCAINE 2 % MUCOSAL SOLN
2 % | Status: AC
Start: 2016-04-20 — End: 2016-04-20
  Administered 2016-04-20: 23:00:00 via OROMUCOSAL

## 2016-04-20 MED ORDER — SODIUM CHLORIDE 0.9% BOLUS IV
0.9 % | Freq: Once | INTRAVENOUS | Status: AC
Start: 2016-04-20 — End: 2016-04-21
  Administered 2016-04-20: 22:00:00 via INTRAVENOUS

## 2016-04-20 MED ORDER — FAMOTIDINE (PF) 20 MG/2 ML IV
20 mg/2 mL | INTRAVENOUS | Status: AC
Start: 2016-04-20 — End: 2016-04-20
  Administered 2016-04-20: 23:00:00 via INTRAVENOUS

## 2016-04-20 MED ORDER — HYDROMORPHONE (PF) 1 MG/ML IJ SOLN
1 mg/mL | INTRAMUSCULAR | Status: AC
Start: 2016-04-20 — End: 2016-04-20
  Administered 2016-04-20: 23:00:00 via INTRAVENOUS

## 2016-04-20 MED ORDER — DIPHENHYDRAMINE HCL 50 MG/ML IJ SOLN
50 mg/mL | INTRAMUSCULAR | Status: AC
Start: 2016-04-20 — End: 2016-04-20
  Administered 2016-04-20: 22:00:00 via INTRAVENOUS

## 2016-04-20 MED ORDER — ONDANSETRON (PF) 4 MG/2 ML INJECTION
4 mg/2 mL | INTRAMUSCULAR | Status: AC
Start: 2016-04-20 — End: 2016-04-20
  Administered 2016-04-20: 23:00:00 via INTRAVENOUS

## 2016-04-20 MED ORDER — HYDROMORPHONE (PF) 1 MG/ML IJ SOLN
1 mg/mL | INTRAMUSCULAR | Status: AC
Start: 2016-04-20 — End: 2016-04-20
  Administered 2016-04-20: 22:00:00 via INTRAVENOUS

## 2016-04-20 MED ORDER — GI COCKTAIL (GVL)
Freq: Four times a day (QID) | ORAL | Status: DC
Start: 2016-04-20 — End: 2016-04-20

## 2016-04-20 MED ORDER — ONDANSETRON (PF) 4 MG/2 ML INJECTION
4 mg/2 mL | INTRAMUSCULAR | Status: AC
Start: 2016-04-20 — End: 2016-04-20
  Administered 2016-04-20: 22:00:00 via INTRAVENOUS

## 2016-04-20 MED ORDER — GI COCKTAIL (GVL)
ORAL | Status: AC
Start: 2016-04-20 — End: 2016-04-20
  Administered 2016-04-20: 23:00:00 via ORAL

## 2016-04-20 MED ORDER — DIPHENHYDRAMINE HCL 50 MG/ML IJ SOLN
50 mg/mL | INTRAMUSCULAR | Status: AC
Start: 2016-04-20 — End: 2016-04-20
  Administered 2016-04-20: via INTRAVENOUS

## 2016-04-20 MED FILL — DIPHENHYDRAMINE HCL 50 MG/ML IJ SOLN: 50 mg/mL | INTRAMUSCULAR | Qty: 1

## 2016-04-20 MED FILL — FAMOTIDINE (PF) 20 MG/2 ML IV: 20 mg/2 mL | INTRAVENOUS | Qty: 2

## 2016-04-20 MED FILL — HYDROMORPHONE (PF) 1 MG/ML IJ SOLN: 1 mg/mL | INTRAMUSCULAR | Qty: 2

## 2016-04-20 MED FILL — LEVETIRACETAM 500 MG/5 ML IV SOLN: 500 mg/5 mL | INTRAVENOUS | Qty: 5

## 2016-04-20 MED FILL — ONDANSETRON (PF) 4 MG/2 ML INJECTION: 4 mg/2 mL | INTRAMUSCULAR | Qty: 2

## 2016-04-20 MED FILL — LEVETIRACETAM 250 MG TAB: 250 mg | ORAL | Qty: 1

## 2016-04-20 MED FILL — GI COCKTAIL (GVL): ORAL | Qty: 30

## 2016-04-20 MED FILL — PROMETHAZINE 25 MG/ML INJECTION: 25 mg/mL | INTRAMUSCULAR | Qty: 2

## 2016-04-20 MED FILL — LIDOCAINE 2 % MUCOSAL SOLN: 2 % | Qty: 15
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
## 2016-04-20 NOTE — ED Notes (Signed)
Med list is completely updated

## 2016-04-20 NOTE — ED Triage Notes (Signed)
Pt reports lower right sided abdominal pain and nausea for the past three days.

## 2016-04-20 NOTE — ED Notes (Addendum)
Pt is sleeping at this time. No s/sx of distress or pain. Respirations even and unlabored

## 2016-04-20 NOTE — ED Notes (Addendum)
Pt continues to sleep at this time. No s/sx of distress, pt's service dog at bedside. Respirations even and unlabored  Per MD, This RN to try PO challenge at 10PM

## 2016-04-20 NOTE — ED Notes (Signed)
Pt instructed to call wife to be picked up due to narcotic admin while here.

## 2016-04-20 NOTE — ED Notes (Signed)
The patient was given their discharge instructions and  was not given prescriptions.   The  patient verbalized understanding and had no additional questions. The patient was alert and was discharged via Ambulatory, without additional complaints at time of discharge.  No apparent distress noted    esign not available

## 2016-04-20 NOTE — ED Notes (Signed)
Pt on o2 stat

## 2016-04-20 NOTE — ED Notes (Signed)
Report received. Care assumed at this time

## 2016-04-21 MED ORDER — ONDANSETRON (PF) 4 MG/2 ML INJECTION
4 mg/2 mL | INTRAMUSCULAR | Status: AC
Start: 2016-04-21 — End: 2016-04-20
  Administered 2016-04-21: 04:00:00 via INTRAVENOUS

## 2016-04-21 MED ORDER — HEPARIN, PORCINE (PF) 100 UNIT/ML IV SYRINGE
100 unit/mL | INTRAVENOUS | Status: AC
Start: 2016-04-21 — End: 2016-04-20
  Administered 2016-04-21: 04:00:00

## 2016-04-21 MED ORDER — ONDANSETRON (PF) 4 MG/2 ML INJECTION
4 mg/2 mL | INTRAMUSCULAR | Status: DC
Start: 2016-04-21 — End: 2016-04-21

## 2016-04-21 MED FILL — MONOJECT PREFILL ADVANCED (PF) 100 UNIT/ML INTRAVENOUS SYRINGE: 100 unit/mL | INTRAVENOUS | Qty: 3

## 2016-04-21 MED FILL — ONDANSETRON (PF) 4 MG/2 ML INJECTION: 4 mg/2 mL | INTRAMUSCULAR | Qty: 4

## 2016-04-23 ENCOUNTER — Emergency Department: Payer: Medicare Other

## 2016-04-23 ENCOUNTER — Emergency Department
Admission: EM | Admit: 2016-04-23 | Discharge: 2016-04-23 | Disposition: A | Discharge status: Home / Self Care | Payer: Medicare Other | Attending: Emergency Medicine | Admitting: Emergency Medicine

## 2016-04-23 DIAGNOSIS — R1084 Generalized abdominal pain: Secondary | ICD-10-CM

## 2016-04-23 DIAGNOSIS — Z86711 Personal history of pulmonary embolism: Secondary | ICD-10-CM | POA: Insufficient documentation

## 2016-04-23 DIAGNOSIS — K219 Gastro-esophageal reflux disease without esophagitis: Secondary | ICD-10-CM | POA: Insufficient documentation

## 2016-04-23 DIAGNOSIS — Z87891 Personal history of nicotine dependence: Secondary | ICD-10-CM | POA: Insufficient documentation

## 2016-04-23 DIAGNOSIS — Z9189 Other specified personal risk factors, not elsewhere classified: Secondary | ICD-10-CM

## 2016-04-23 DIAGNOSIS — Z9114 Patient's other noncompliance with medication regimen: Secondary | ICD-10-CM | POA: Insufficient documentation

## 2016-04-23 DIAGNOSIS — Z5329 Procedure and treatment not carried out because of patient's decision for other reasons: Secondary | ICD-10-CM

## 2016-04-23 MED ORDER — ACETAMINOPHEN 325 MG PO TABS
650.0000 mg | ORAL_TABLET | Freq: Once | ORAL | Status: DC
Start: 2016-04-23 — End: 2016-04-23
  Filled 2016-04-23 (×2): qty 2

## 2016-04-23 MED ORDER — ONDANSETRON HCL 4 MG/2ML IJ SOLN
4.0000 mg | Freq: Once | INTRAMUSCULAR | Status: DC
Start: 2016-04-23 — End: 2016-04-23
  Filled 2016-04-23: qty 2

## 2016-04-23 MED ORDER — SODIUM CHLORIDE 0.9 % IV BOLUS
1000.0000 mL | Freq: Once | INTRAVENOUS | Status: DC
Start: 2016-04-23 — End: 2016-04-23

## 2016-04-23 NOTE — ED Provider Notes (Signed)
Physician/Midlevel provider first contact with patient: 04/23/16 0113         History     Chief Complaint   Patient presents with   . Flank Pain     HPI Comments: Per patient, on-going abdominal pain w/ N/V for last few weeks.  No fevers.  Reports being seen in the [facility] for same pain and had CT scan and lab work completed.  Patient was told he had kidney stones.  Pain has continued.  No CP/SOB, leg swelling.  Reports complicated past medical history 2/2 IED blast and multiple follow on surgeries w/ h/o PTSD, seizures and chronic headaches.  Patient accompanied by his service dog.    Patient is a 30 y.o. male presenting with flank pain. The history is provided by the patient. No language interpreter was used.   Flank Pain  This is a new problem. The current episode started 1 to 4 weeks ago. The problem occurs constantly. The problem has been waxing and waning. Associated symptoms include abdominal pain, nausea and vomiting. Pertinent negatives include no chest pain or fever. The treatment provided no relief.            Past Medical History   Diagnosis Date   . Traumatic brain injury    . PTSD (post-traumatic stress disorder)    . Gastroesophageal reflux disease    . Convulsions    . PE (pulmonary embolism)    . Gastroparesis    . Pyloric stenosis    . Migraine    . Intussusception    . Heterozygous factor V Leiden mutation    . Sepsis        Past Surgical History   Procedure Laterality Date   . Multiple surgeries on stomach     . Ied exploded     . Cholecystectomy     . Appendectomy     . Back surgery     . Hand surgery     . Egd, biopsy N/A 01/08/2016     Procedure: EGD, BIOPSY with Dilation possible botox;  Surgeon: Bland Span, MD;  Location: [facility];  Service: Gastroenterology;  Laterality: N/A;   . Egd, botox injection Left 03/06/2016     Procedure: EGD, BOTOX INJECTION;  Surgeon: Pershing Proud, MD;  Location: [facility];  Service: Gastroenterology;  Laterality: Left;       Family History    Problem Relation Age of Onset   . Ulcers Neg Hx    . Colon cancer Neg Hx    . Hepatitis Neg Hx    . Breast cancer Mother    . Breast cancer Maternal Grandmother        Social  Social History   Substance Use Topics   . Smoking status: Former Games developer   . Smokeless tobacco: None   . Alcohol Use: No       .     Allergies   Allergen Reactions   . Aspirin    . Bentyl [Dicyclomine]    . Amoxicillin    . Compazine [Prochlorperazine]    . Coumadin [Warfarin]    . Demerol [Meperidine]    . Fentanyl    . Ketorolac Nausea And Vomiting   . Morphine    . Pamelor [Nortriptyline]    . Penicillins    . Reglan [Metoclopramide]    . Rizatriptan    . Robinul [Glycopyrrolate]    . Sumatriptan    . Tomato    . Tripelennamine    .  Triptans        Home Medications                   butalbital-acetaminophen-caffeine (FIORICET, ESGIC) 50-325-40 MG per tablet     Take 1 tablet by mouth every 6 (six) hours as needed for Headaches.     butorphanol (STADOL) 10 MG/ML nasal spray     2 sprays by Nasal route every 8 (eight) hours as needed (Headaches).     clonazePAM (KLONOPIN) 1 MG tablet     Take 1 mg by mouth 2 (two) times daily as needed.     cyclobenzaprine (FLEXERIL) 10 MG tablet     Take 10 mg by mouth 3 (three) times daily.     diphenhydrAMINE (BENADRYL) 25 mg capsule     Take 25 mg by mouth every 6 (six) hours as needed for Itching.     dronabinol (MARINOL) 5 MG capsule     Take 5 mg by mouth 3 (three) times daily.     enoxaparin (LOVENOX) 40 MG/0.4ML Solution     Inject 40 mg into the skin every 12 (twelve) hours. For DVT prophylaxis     hydrOXYzine (VISTARIL) 25 MG capsule     Take 25 mg by mouth every 6 (six) hours as needed for Itching.     levETIRAcetam (KEPPRA XR) 500 MG 24 hr tablet     Take 250 mg by mouth 3 (three) times daily with meals.        omeprazole (PRILOSEC) 40 MG capsule     Take 40 mg by mouth daily.        ondansetron (ZOFRAN) 8 MG tablet     Take by mouth every 4 (four) hours as needed for Nausea.     promethazine  (PHENERGAN) 25 MG tablet     Take 25 mg by mouth every 6 (six) hours as needed.     Suvorexant (BELSOMRA) 20 MG Tab     Take 1 tablet by mouth nightly.     topiramate (TOPAMAX) 100 MG tablet     Take 100 mg by mouth daily.     topiramate (TOPAMAX) 100 MG tablet     Take 300 mg by mouth nightly.     traMADol (ULTRAM) 50 MG tablet     Take 1 tablet (50 mg total) by mouth every 6 (six) hours as needed for Pain.     traMODol (ULTRAM-ER) 200 MG 24 hr tablet                Review of Systems   Constitutional: Negative for fever.   Cardiovascular: Negative for chest pain.   Gastrointestinal: Positive for nausea, vomiting and abdominal pain.   Genitourinary: Positive for flank pain.   All other systems reviewed and are negative.      Physical Exam    BP: (!) 165/99 mmHg, Heart Rate: 78, Temp: 97.9 F (36.6 C), Resp Rate: 22, SpO2: 95 %, Weight: 92.987 kg    Physical Exam   Constitutional: He is oriented to person, place, and time. He appears well-developed and well-nourished. No distress.   HENT:   Head: Normocephalic and atraumatic.   Nose: Nose normal.   Mouth/Throat: Oropharynx is clear and moist.   Eyes: Conjunctivae and EOM are normal. Pupils are equal, round, and reactive to light.   Neck: Normal range of motion. Neck supple. No JVD present.   Cardiovascular: Normal rate, regular rhythm, normal heart sounds and intact distal pulses.  Pulmonary/Chest: Effort normal and breath sounds normal. No respiratory distress.   Abdominal: Soft. He exhibits no distension. There is tenderness. There is no rebound and no guarding.   Midline scar  Generalized TTP; none w/ distraction  No CVA TTP   Musculoskeletal: Normal range of motion. He exhibits no edema.   Neurological: He is alert and oriented to person, place, and time. No cranial nerve deficit. He exhibits normal muscle tone. Coordination normal.   Skin: Skin is warm and dry. No rash noted.   Psychiatric: He has a normal mood and affect. His behavior is normal. Judgment  normal.   Nursing note and vitals reviewed.        MDM and ED Course     ED Medication Orders     Start Ordered     Status Ordering Provider    04/23/16 0108 04/23/16 0107  acetaminophen (TYLENOL) tablet 650 mg   Once     Route: Oral  Ordered Dose: 650 mg     Last MAR action:  Pending Rosalind Guido G    04/23/16 0107 04/23/16 0106  sodium chloride 0.9 % bolus 1,000 mL   Once     Route: Intravenous  Ordered Dose: 1,000 mL     Ordered Hazley Dezeeuw G    04/23/16 0107 04/23/16 0106  ondansetron (ZOFRAN) injection 4 mg   Once     Route: Intravenous  Ordered Dose: 4 mg     Ordered Dashton Czerwinski G             MDM  Number of Diagnoses or Management Options  At risk for abuse of opiates:   Generalized abdominal pain:   Left against medical advice:   Diagnosis management comments: BP 165/99 mmHg  Pulse 78  Temp(Src) 97.9 F (36.6 C) (Oral)  Resp 22  Ht 5\' 11"  (1.803 m)  Wt 92.987 kg  BMI 28.60 kg/m2  SpO2 95%    HDS, afebrile w/ h/o chronic abdominal pain, multiple abdominal surgeries, PTSD, chronic headaches w/ high risk for chronic opiate abuse and drug seeking behavior w/ continued abdominal pain.  CT and labs reviewed from previous visit (01July, patient has print out of results) w/o obstructive uropathy and normal WBC, chem-7.  Hydrated on exam and well appearing.  Exam unreliable but no significant tenderness noted to abdomen and he is not distended.  Discussed with patient his medication history, chronic medications, multiple drug allergies and advised him I would not be prescribing him dilaudid for his pain and there were alternate therapies to give to see if his pain improves while in ED.  In meantime, suggested we recheck his labs, urine and perform an AAS XRAY prior to final disposition.  Patient agreed with plan.  Several minutes later patient left the Emergency Department and declined further evaluation.  Declined to wait for discharge paperwork or to review AMA forms.       Amount and/or Complexity  of Data Reviewed  Clinical lab tests: ordered  Tests in the radiology section of CPT: ordered  Review and summarize past medical records: yes    Patient Progress  Patient progress: stable            Procedures    Clinical Impression & Disposition     Clinical Impression  Final diagnoses:   Generalized abdominal pain   Left against medical advice   At risk for abuse of opiates        ED Disposition     AMA Date:  04/23/2016  Patient: [name].  Admitted: 04/23/2016 12:55 AM  Attending Provider: Windell Hummingbird, DO    [name]. or his authorized caregiver has made the decision for the patient to leave the emergency department against the advice of [place], [place]. He or his authorized caregiver has been informed and understands the inherent risks, including death, further disability.  He or his authorized caregiver has decided to accept the responsibility for this decision. [name]. and all necessary parties have been advised that he may return for further evaluation or treatment. His condition at time of discharge was stable.  [name]. had current vital signs as follows:  BP 165/99 Pulse 78 Temp 97.9 F (36.6 C) Temp Src: Oral Resp 22 Ht 5\' 11"  (1.803 m) Wt 92.987 kg             Discharge Medication List as of 04/23/2016  1:33 AM                      Windell Hummingbird, DO  04/23/16 [phone]

## 2016-04-23 NOTE — ED Notes (Signed)
Right flank pain history of kidney stones 

## 2016-04-23 NOTE — ED Notes (Signed)
After 3 attempts to start iv pt decided to go to [facility] left after being seen did not sign ama

## 2017-07-06 IMAGING — CT CT ABD-PELV W/ CM
2 of 6 series · 16 of 46 positions shown, 18 images · IV contrast (omnipaque)
Comparison: None.

CLINICAL DATA: 29-year-old male with right upper quadrant abdominal
pain

EXAM:
CT ABDOMEN AND PELVIS WITH CONTRAST
TECHNIQUE: Multidetector CT imaging of the abdomen and pelvis was performed
using the standard protocol following bolus administration of
intravenous contrast.
CONTRAST:  50mL OMNIPAQUE IOHEXOL 300 MG/ML SOLN, 100mL OMNIPAQUE
IOHEXOL 300 MG/ML SOLN

[Series 4: abd/pelvis 5.0 b31f · axial · 0.80mm/px · z∈[+246,+650]mm · 13 of 93 slices shown, 15 images]
[im 6/93  soft-tissue]
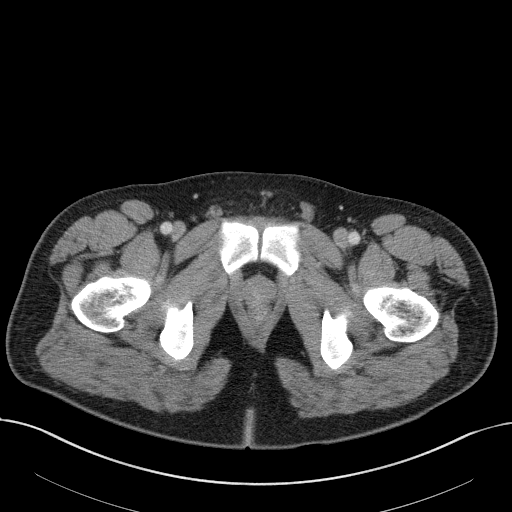
[im 6/93  bone]
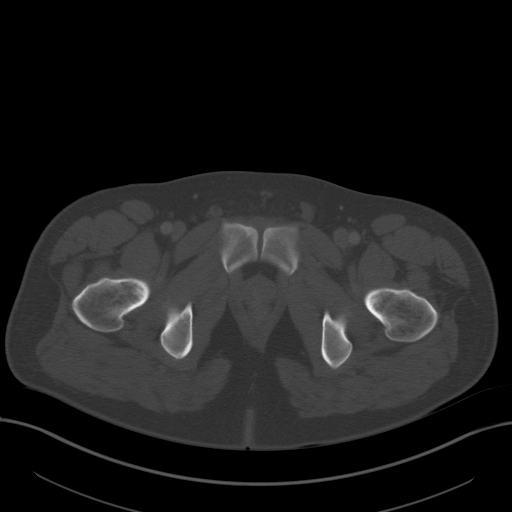
[im 11/93  soft-tissue]
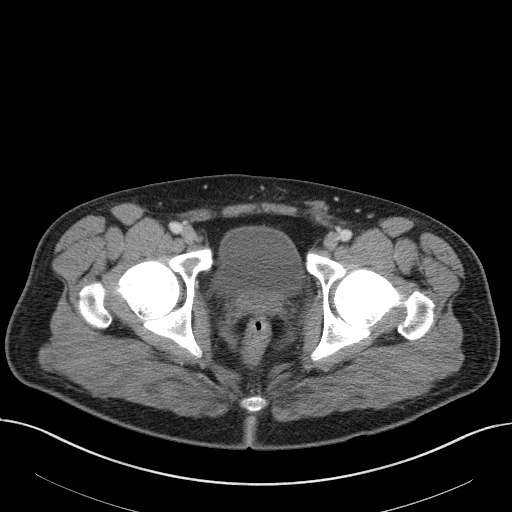
[im 22/93  soft-tissue]
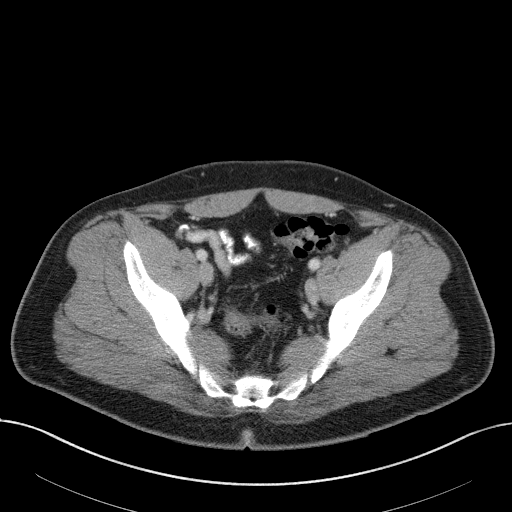
[im 28/93  soft-tissue]
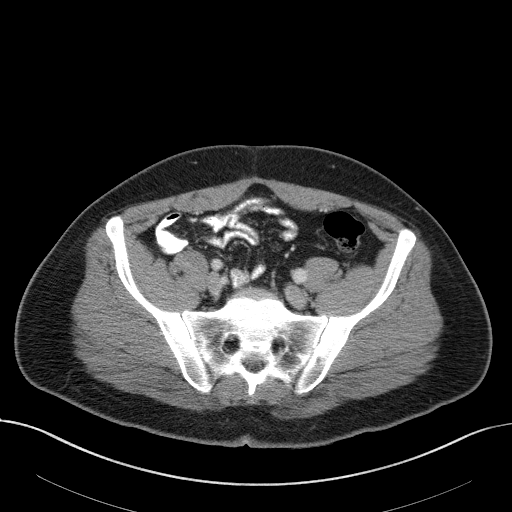
[im 33/93  soft-tissue]
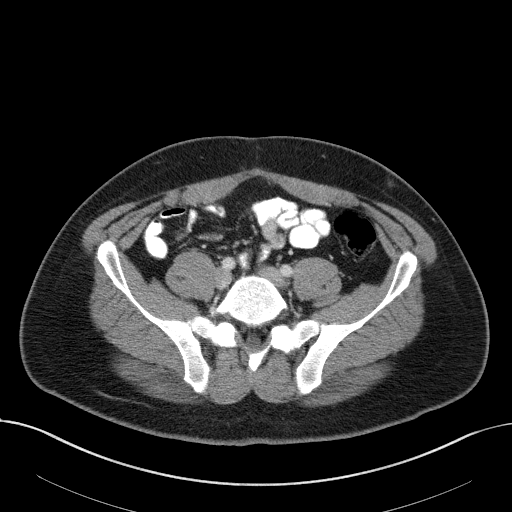
[im 38/93  soft-tissue]
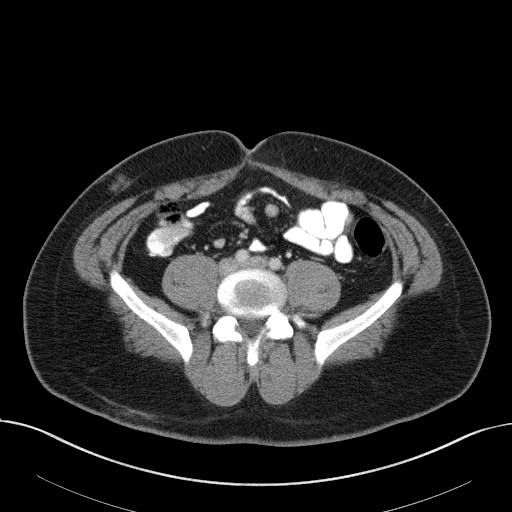
[im 49/93  soft-tissue]
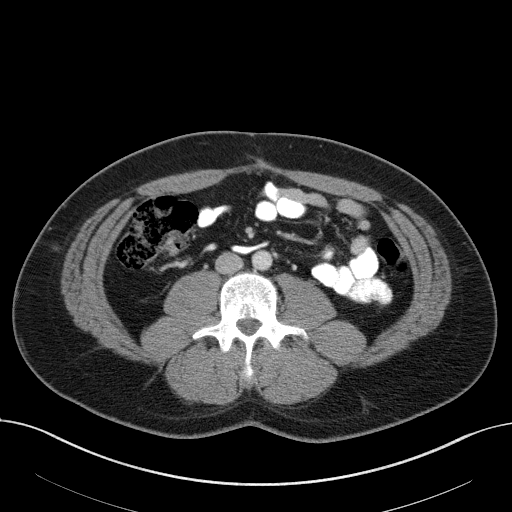
[im 55/93  soft-tissue]
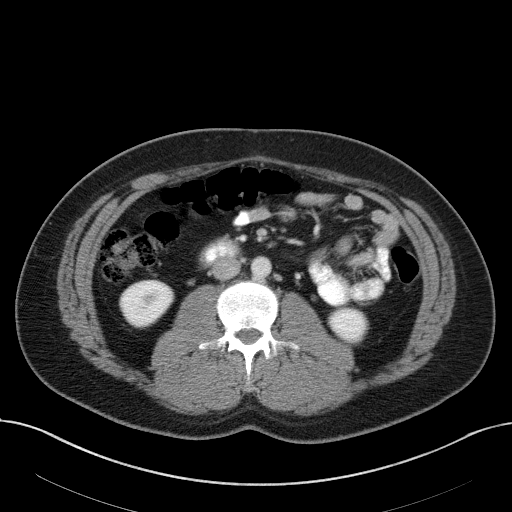
[im 60/93  soft-tissue]
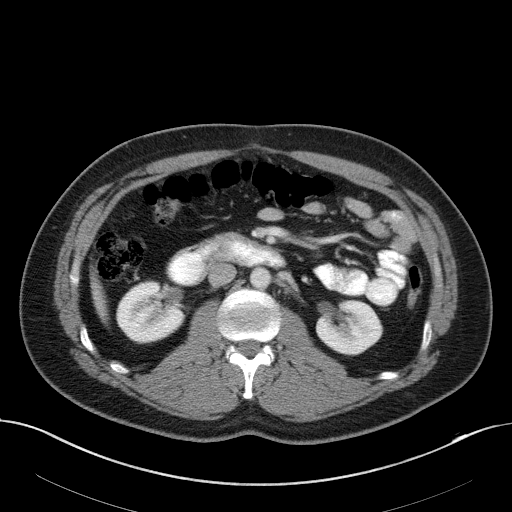
[im 60/93  bone]
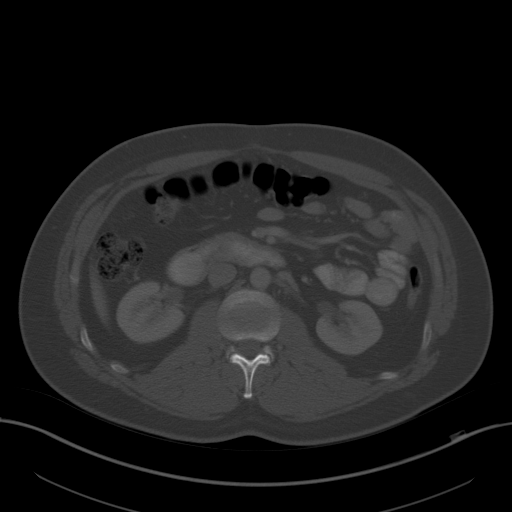
[im 65/93  soft-tissue]
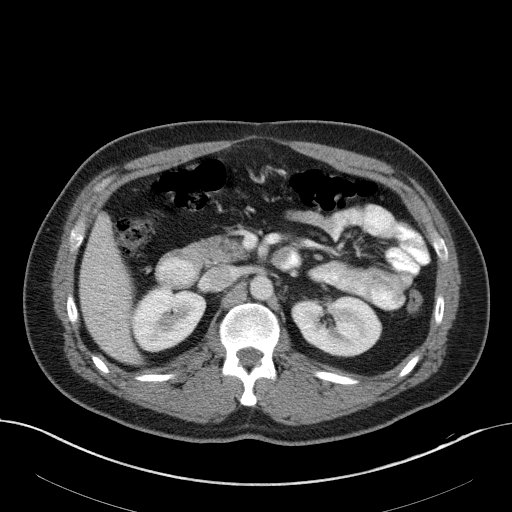
[im 71/93  soft-tissue]
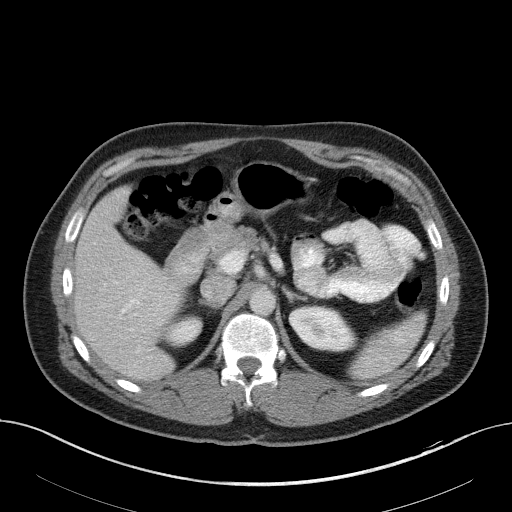
[im 82/93  soft-tissue]
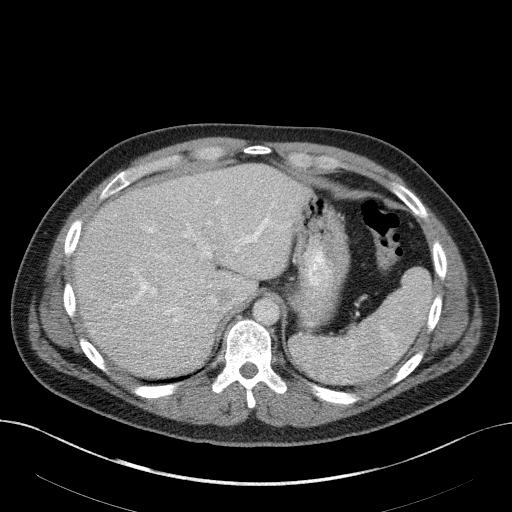
[im 87/93  soft-tissue]
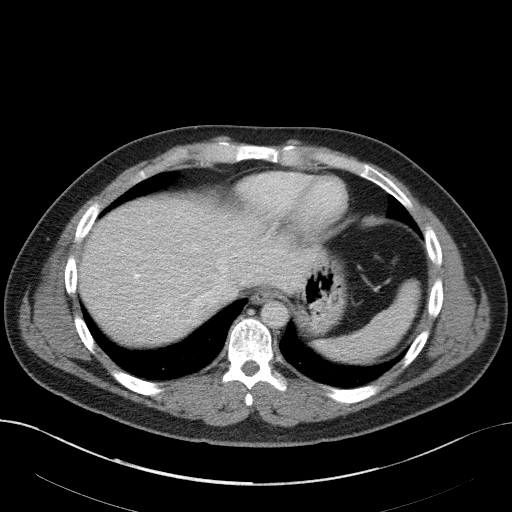

[Series 7: abd/pelvis 3.0 coronal · coronal · 0.85mm/px · 3 of 90 slices shown]
[im 30/90  soft-tissue]
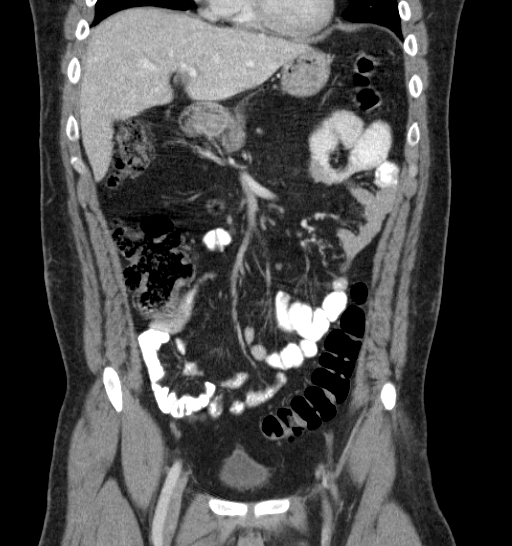
[im 40/90  soft-tissue]
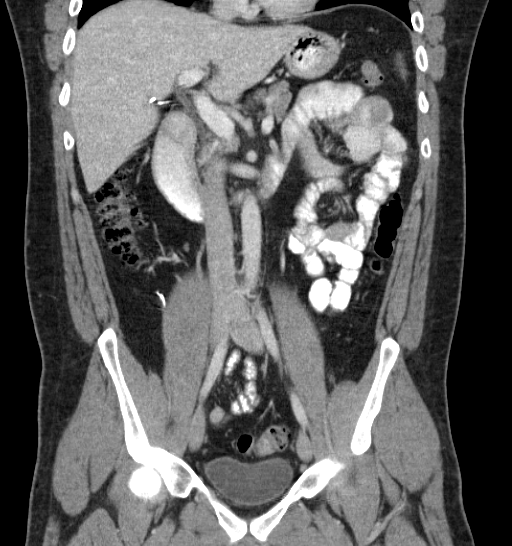
[im 50/90  soft-tissue]
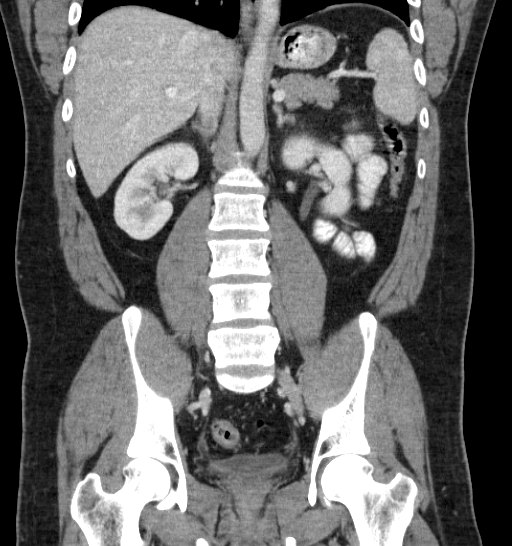

[16 of 46 positions shown; findings below may reference images not displayed]

FINDINGS: The visualized lung bases are clear. No intra-abdominal free air or
free fluid.

Cholecystectomy. The liver, pancreas, spleen, adrenal glands,
kidneys, visualized ureters, and urinary bladder appear
unremarkable. The prostate and seminal vesicles are grossly
unremarkable.

There is minimal apparent thickening of the second portion of the
duodenum, likely artifactual. Duodenitis is less likely. Clinical
correlation is recommended. There is no evidence of bowel
obstruction. Appendectomy.

The abdominal aorta and IVC appear patent. There is no portal venous
gas. No adenopathy.

Midline anterior abdominal wall vertical incisional scar. There are
small midline supraumbilical peritoneal defects with small fat
containing hernias. There is mild angulation of the anterior
mesentery along the surgical incisional scar. There is a small fat
containing umbilical hernia. There is no significant associated
inflammatory changes. Small subcutaneous nodular density in the
right lateral abdominal wall likely related to subcutaneous
injections. The osseous structures are intact.
IMPRESSION: No definite acute intra-abdominal pelvic pathology. Artifact versus
less likely mild thickening of the duodenum. Clinical correlation is
recommended.

Midline vertical anterior abdominal wall incisional scar with
multiple small fat containing supraumbilical hernias. There is mild
stranding of the subcutaneous and mesentery fat along the incisional
scar. No fluid collection.
